# Patient Record
Sex: Female | Born: 1960 | Race: White | Hispanic: No | Marital: Married | Smoking: Never smoker
Health system: Southern US, Community
[De-identification: ages and names within clinical notes are randomized; demographics above are authoritative.]

## PROBLEM LIST (undated history)

## (undated) DIAGNOSIS — I10 Essential (primary) hypertension: Secondary | ICD-10-CM

## (undated) DIAGNOSIS — G43909 Migraine, unspecified, not intractable, without status migrainosus: Secondary | ICD-10-CM

## (undated) DIAGNOSIS — K219 Gastro-esophageal reflux disease without esophagitis: Secondary | ICD-10-CM

## (undated) DIAGNOSIS — IMO0001 Reserved for inherently not codable concepts without codable children: Secondary | ICD-10-CM

## (undated) DIAGNOSIS — M719 Bursopathy, unspecified: Secondary | ICD-10-CM

## (undated) DIAGNOSIS — R011 Cardiac murmur, unspecified: Secondary | ICD-10-CM

## (undated) DIAGNOSIS — E559 Vitamin D deficiency, unspecified: Secondary | ICD-10-CM

## (undated) HISTORY — DX: Reserved for inherently not codable concepts without codable children: IMO0001

## (undated) HISTORY — DX: Migraine, unspecified, not intractable, without status migrainosus: G43.909

## (undated) HISTORY — DX: Vitamin D deficiency, unspecified: E55.9

## (undated) HISTORY — DX: Gastro-esophageal reflux disease without esophagitis: K21.9

## (undated) HISTORY — DX: Cardiac murmur, unspecified: R01.1

## (undated) HISTORY — DX: Essential (primary) hypertension: I10

---
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
## 1999-12-23 ENCOUNTER — Encounter: Admission: RE | Admit: 1999-12-23 | Discharge: 1999-12-23 | Payer: Self-pay | Admitting: Family Medicine

## 1999-12-23 ENCOUNTER — Encounter: Payer: Self-pay | Admitting: Family Medicine

## 2001-03-01 ENCOUNTER — Other Ambulatory Visit: Admission: RE | Admit: 2001-03-01 | Discharge: 2001-03-01 | Payer: Self-pay | Admitting: Obstetrics and Gynecology

## 2001-05-10 ENCOUNTER — Encounter: Payer: Self-pay | Admitting: Obstetrics and Gynecology

## 2001-05-10 ENCOUNTER — Encounter: Admission: RE | Admit: 2001-05-10 | Discharge: 2001-05-10 | Payer: Self-pay | Admitting: Obstetrics and Gynecology

## 2002-03-21 ENCOUNTER — Other Ambulatory Visit: Admission: RE | Admit: 2002-03-21 | Discharge: 2002-03-21 | Payer: Self-pay | Admitting: *Deleted

## 2002-05-14 ENCOUNTER — Encounter: Payer: Self-pay | Admitting: Obstetrics and Gynecology

## 2002-05-14 ENCOUNTER — Encounter: Admission: RE | Admit: 2002-05-14 | Discharge: 2002-05-14 | Payer: Self-pay | Admitting: Obstetrics and Gynecology

## 2003-03-27 ENCOUNTER — Other Ambulatory Visit: Admission: RE | Admit: 2003-03-27 | Discharge: 2003-03-27 | Payer: Self-pay | Admitting: Gynecology

## 2003-05-27 ENCOUNTER — Encounter: Admission: RE | Admit: 2003-05-27 | Discharge: 2003-05-27 | Payer: Self-pay | Admitting: Obstetrics and Gynecology

## 2004-04-02 ENCOUNTER — Other Ambulatory Visit: Admission: RE | Admit: 2004-04-02 | Discharge: 2004-04-02 | Payer: Self-pay | Admitting: Gynecology

## 2004-06-02 ENCOUNTER — Encounter: Admission: RE | Admit: 2004-06-02 | Discharge: 2004-06-02 | Payer: Self-pay | Admitting: Gynecology

## 2004-07-25 HISTORY — PX: CHOLECYSTECTOMY: SHX55

## 2004-07-25 HISTORY — PX: LEG SURGERY: SHX1003

## 2004-08-18 ENCOUNTER — Other Ambulatory Visit: Admission: RE | Admit: 2004-08-18 | Discharge: 2004-08-18 | Payer: Self-pay | Admitting: Gynecology

## 2005-02-03 ENCOUNTER — Observation Stay (HOSPITAL_COMMUNITY): Admission: AD | Admit: 2005-02-03 | Discharge: 2005-02-04 | Payer: Self-pay | Admitting: Interventional Cardiology

## 2005-02-03 ENCOUNTER — Encounter: Payer: Self-pay | Admitting: Emergency Medicine

## 2005-02-28 ENCOUNTER — Ambulatory Visit (HOSPITAL_COMMUNITY): Admission: RE | Admit: 2005-02-28 | Discharge: 2005-03-01 | Payer: Self-pay | Admitting: General Surgery

## 2005-02-28 ENCOUNTER — Encounter (INDEPENDENT_AMBULATORY_CARE_PROVIDER_SITE_OTHER): Payer: Self-pay | Admitting: Specialist

## 2005-04-04 ENCOUNTER — Other Ambulatory Visit: Admission: RE | Admit: 2005-04-04 | Discharge: 2005-04-04 | Payer: Self-pay | Admitting: Gynecology

## 2005-06-23 ENCOUNTER — Encounter: Admission: RE | Admit: 2005-06-23 | Discharge: 2005-06-23 | Payer: Self-pay | Admitting: Gynecology

## 2005-11-05 IMAGING — RF DG CHOLANGIOGRAM OPERATIVE
1 series · 4 of 4 positions shown · non-contrast
Comparison: none

CLINICAL DATA: Laparoscopic cholecystectomy.
 OPERATIVE CHOLANGIOGRAM:

[Series 1: run · 4 of 81 frames shown]
[frame 13/81]
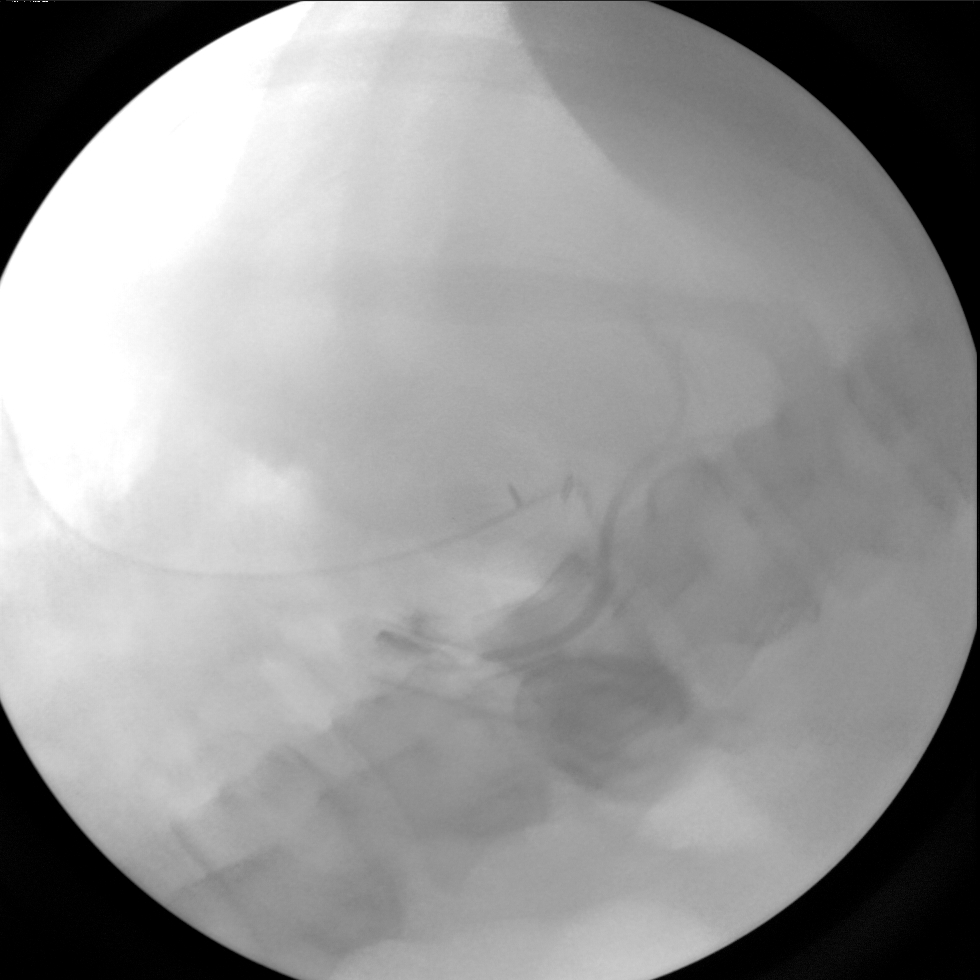
[frame 41/81]
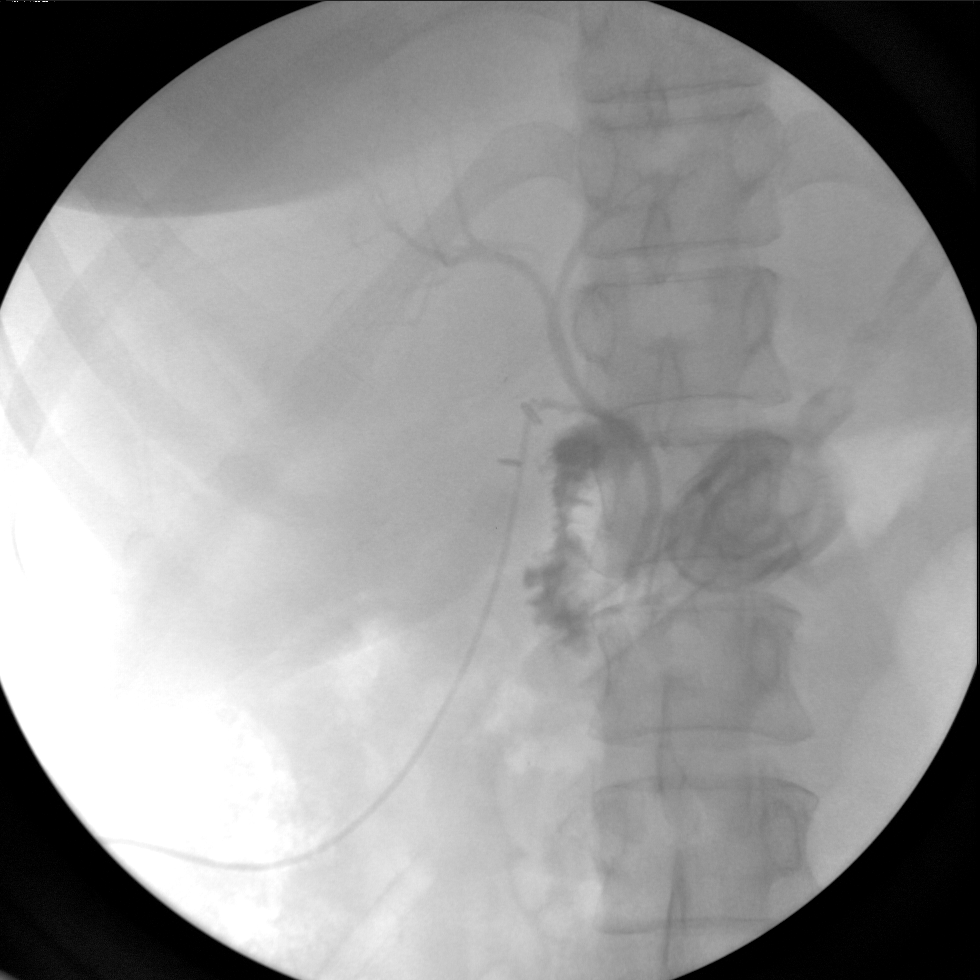
[frame 69/81]
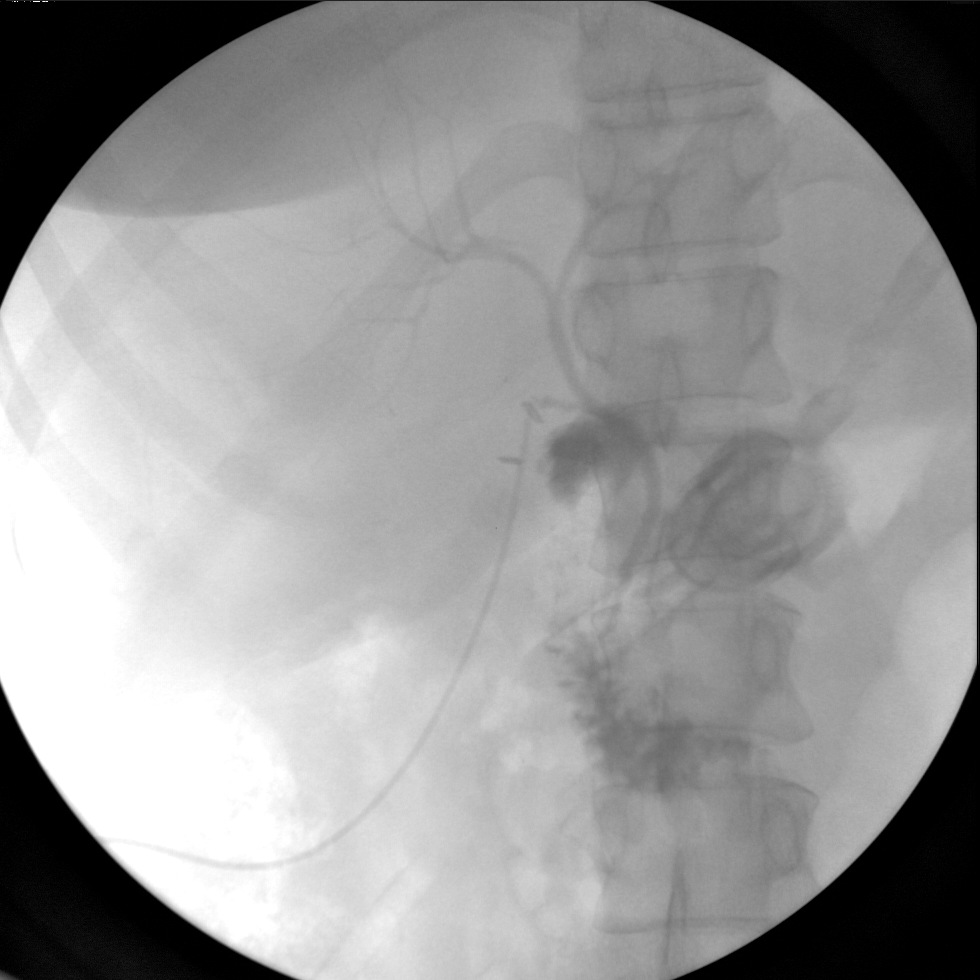
[frame 79/81]
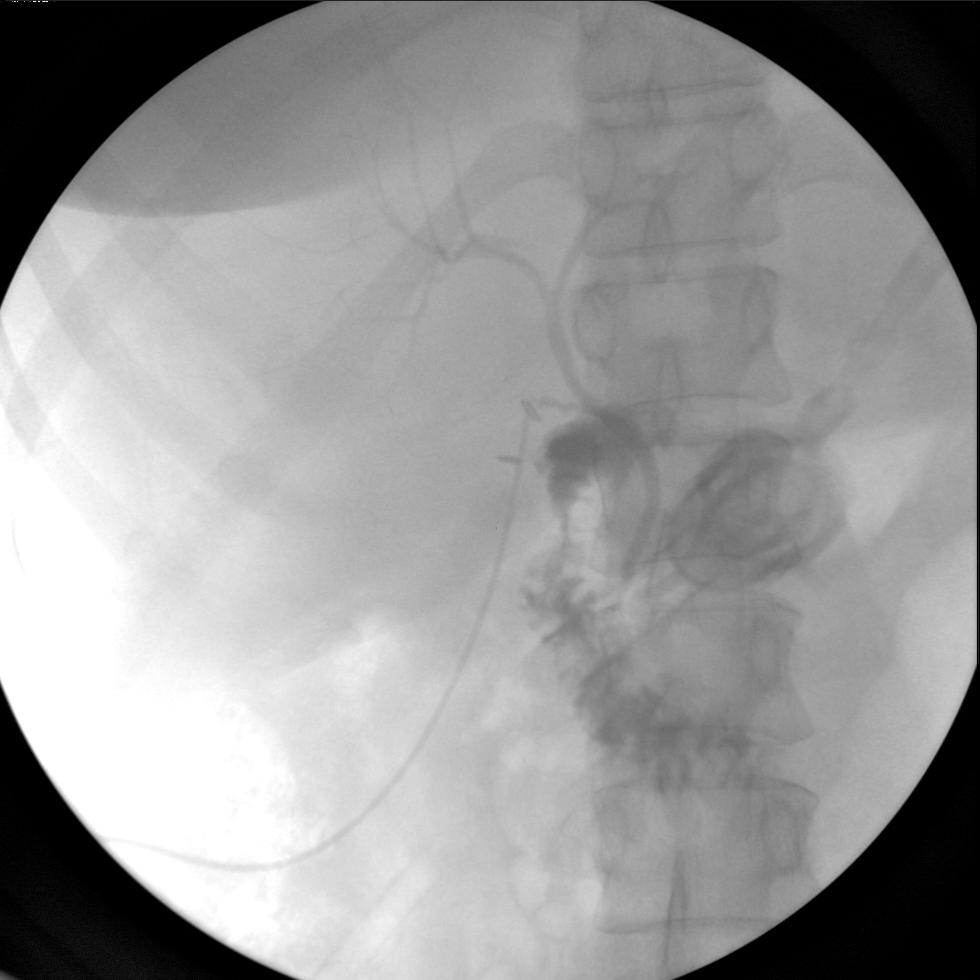

[4 of 4 positions shown; findings below may reference images not displayed]

FINDINGS: A c-arm run from the operating room is reviewed on PACS.  Normal intra- and extrahepatic biliary caliber.  No filling defects or obstruction.  There is reflux into the pancreatic duct.
IMPRESSION: Satisfactory operative cholangiogram.

## 2006-04-05 ENCOUNTER — Other Ambulatory Visit: Admission: RE | Admit: 2006-04-05 | Discharge: 2006-04-05 | Payer: Self-pay | Admitting: Gynecology

## 2007-04-11 ENCOUNTER — Other Ambulatory Visit: Admission: RE | Admit: 2007-04-11 | Discharge: 2007-04-11 | Payer: Self-pay | Admitting: Gynecology

## 2007-06-28 ENCOUNTER — Encounter: Admission: RE | Admit: 2007-06-28 | Discharge: 2007-06-28 | Payer: Self-pay | Admitting: Family Medicine

## 2008-04-11 ENCOUNTER — Ambulatory Visit: Payer: Self-pay | Admitting: Women's Health

## 2008-04-11 ENCOUNTER — Other Ambulatory Visit: Admission: RE | Admit: 2008-04-11 | Discharge: 2008-04-11 | Payer: Self-pay | Admitting: Obstetrics and Gynecology

## 2008-04-11 ENCOUNTER — Encounter: Payer: Self-pay | Admitting: Women's Health

## 2008-07-23 ENCOUNTER — Encounter: Admission: RE | Admit: 2008-07-23 | Discharge: 2008-07-23 | Payer: Self-pay | Admitting: Family Medicine

## 2009-03-16 ENCOUNTER — Ambulatory Visit: Payer: Self-pay | Admitting: Oncology

## 2009-03-20 LAB — CHCC SMEAR

## 2009-03-20 LAB — CBC WITH DIFFERENTIAL/PLATELET
BASO%: 0.4 % (ref 0.0–2.0)
Basophils Absolute: 0 10*3/uL (ref 0.0–0.1)
EOS%: 1.7 % (ref 0.0–7.0)
HGB: 13.4 g/dL (ref 11.6–15.9)
MCH: 28.9 pg (ref 25.1–34.0)
RDW: 13.1 % (ref 11.2–14.5)
lymph#: 2.9 10*3/uL (ref 0.9–3.3)

## 2009-03-20 LAB — ERYTHROCYTE SEDIMENTATION RATE: Sed Rate: 30 mm/hr (ref 0–30)

## 2009-03-26 LAB — COMPREHENSIVE METABOLIC PANEL
ALT: 10 U/L (ref 0–35)
AST: 11 U/L (ref 0–37)
Alkaline Phosphatase: 64 U/L (ref 39–117)
CO2: 19 mEq/L (ref 19–32)
Sodium: 140 mEq/L (ref 135–145)
Total Bilirubin: 0.4 mg/dL (ref 0.3–1.2)
Total Protein: 7.2 g/dL (ref 6.0–8.3)

## 2009-03-26 LAB — C-REACTIVE PROTEIN: CRP: 3 mg/dL — ABNORMAL HIGH

## 2009-04-10 ENCOUNTER — Encounter: Payer: Self-pay | Admitting: Women's Health

## 2009-04-10 ENCOUNTER — Ambulatory Visit: Payer: Self-pay | Admitting: Women's Health

## 2009-04-10 ENCOUNTER — Other Ambulatory Visit: Admission: RE | Admit: 2009-04-10 | Discharge: 2009-04-10 | Payer: Self-pay | Admitting: Obstetrics and Gynecology

## 2009-07-23 ENCOUNTER — Ambulatory Visit: Payer: Self-pay | Admitting: Oncology

## 2009-07-28 LAB — CBC WITH DIFFERENTIAL/PLATELET
Basophils Absolute: 0.1 10*3/uL (ref 0.0–0.1)
HGB: 13.4 g/dL (ref 11.6–15.9)
MCHC: 32.9 g/dL (ref 31.5–36.0)
MONO%: 3.9 % (ref 0.0–14.0)
NEUT%: 60 % (ref 38.4–76.8)
WBC: 10.6 10*3/uL — ABNORMAL HIGH (ref 3.9–10.3)

## 2010-01-08 ENCOUNTER — Encounter: Admission: RE | Admit: 2010-01-08 | Discharge: 2010-01-08 | Payer: Self-pay | Admitting: Gynecology

## 2010-04-12 ENCOUNTER — Ambulatory Visit: Payer: Self-pay | Admitting: Women's Health

## 2010-04-12 ENCOUNTER — Other Ambulatory Visit: Admission: RE | Admit: 2010-04-12 | Discharge: 2010-04-12 | Payer: Self-pay | Admitting: Obstetrics and Gynecology

## 2010-08-15 ENCOUNTER — Encounter: Payer: Self-pay | Admitting: Gynecology

## 2010-12-10 NOTE — Discharge Summary (Signed)
Loretta Contreras, Loretta Contreras                ACCOUNT NO.:  [id number]   MEDICAL RECORD NO.:  [id number]          PATIENT TYPE:  INP   LOCATION:  4712                         FACILITY:  [facility]   PHYSICIAN:  Lyn Records III, M.D.DATE OF BIRTH:  15-Oct-1960   DATE OF ADMISSION:  02/03/2005  DATE OF DISCHARGE:  02/04/2005                                 DISCHARGE SUMMARY   REASON FOR ADMISSION:  Chest pain.   DISCHARGE DIAGNOSIS:  1.  Chest pain, etiology not identified, no evidence of myocardial      infarction or inducible ischemia was noted.  2.  History of recurrent migraine headache.  3.  Positive risk factors for coronary artery disease including an elevated      sensitivity to CRP and positive family history.   CONDITION ON DISCHARGE:  Improved.   DISCHARGE MEDICATIONS:  1.  Ketoprofen 75 mg as needed for migraines.  2.  Birth control pills.  3.  Sublingual nitroglycerin for recurrent, prolonged chest discomfort.   DISCHARGE INSTRUCTIONS:  The patient is to make an appointment with her  family medical doctor at [facility] to have review evaluation  which should include a GI evaluation.  I have informed the patient that if  she continues to have chest discomfort and, certainly, if she has chest  discomfort responsive to nitroglycerin, she may need coronary angiography or  CT angiography of the coronaries.  She has an elevated high sensitivity CRP  and if her lipids are elevated, she should be placed on statin therapy.  I  have also instructed her that if she continues to have recurrences of chest  discomfort, she should recontact me.  She has the contact information.   HOSPITAL COURSE:  The patient was hospitalized on February 03, 2005, following  an episode of pain.  There were no acute EKG changes or laboratory  abnormalities.  After admission and with a totally normal exam, three sets  of enzymes were negative for evidence of infarction.  A high sensitivity CRP  was noted  to be 22.1.  A lipid profile was not performed, TSH was not  performed.  Serial EKGs did not reveal any evolutionary changes.  A stress  Cardiolite was negative for evidence of infarction and LV function was  normal.  She is discharged to be followed up by her primary care physician  to have a GI workup.  She may need further cardiac evaluation if these  symptoms continue.  She should probably also have a TSH done since she has  been complaining of some palpitations.  This was not performed while  hospitalized.       HWS/MEDQ  D:  02/04/2005  T:  02/04/2005  Job:  [id number]

## 2010-12-10 NOTE — Op Note (Signed)
NAMEAREANA, Loretta Contreras                ACCOUNT NO.:  [id number]   MEDICAL RECORD NO.:  [id number]          PATIENT TYPE:  OIB   LOCATION:  2550                         FACILITY:  [facility]   PHYSICIAN:  Gita Kudo, M.D. DATE OF BIRTH:  [birth year]   DATE OF PROCEDURE:  02/28/2005  DATE OF DISCHARGE:                                 OPERATIVE REPORT   OPERATIVE PROCEDURE:  Laparoscopic cholecystectomy with intraoperative  cholangiogram.   SURGEON:  Dr. Maryagnes Amos.   ASSISTANT:  Dr. Lurene Shadow.   ANESTHESIA:  General endotracheal.   PREOPERATIVE DIAGNOSIS:  Gallstones.   POSTOP DIAGNOSIS:  Gallstones, plus normal cholangiogram.   CLINICAL SUMMARY:  50 year old female with abdominal pain, gallbladder  ultrasound showing stones, normal liver function studies, admitted for  elective surgery.   OPERATIVE FINDINGS:  The patient multiple adhesions around her gallbladder  that appeared more congenital than inflammatory. The anatomy was normal. The  cholangiogram appeared normal without obstruction or defect.   OPERATIVE PROCEDURE:  Under satisfactory general endotracheal anesthesia,  the patient was positioned, prepped and draped in standard fashion. A total  of 25 mL of 0.5% Marcaine was infiltrated at the skin incision sites for  postop analgesia. A midline incision made at the umbilicus inferiorly and  carried into the peritoneum. Midline defect controlled with a figure-of-  eight zero Vicryl suture and Hassan port inserted and secured. Good CO2  pneumoperitoneum established and camera placed. Under direct vision, two #5  ports placed laterally and a second #10 medially. Lateral graspers gave  excellent exposure and operating through the medial port, we carefully took  down all the adhesions. The cystic duct was identified, dissected  circumferentially and when certain of the anatomy, a clip placed near the  gallbladder. Incision made, percutaneous catheter placed into the duct and  good  cholangiogram obtained. After reviewing it, catheter removed, duct  controlled with multiple clips and divided. Likewise the artery was  circumferentially dissected, controlled with multiple clips and divided.  Gallbladder then removed from the liver bed from below upward with  coagulation for both hemostasis and dissection. Small hole made in the  gallbladder and spillage of clear bile but no stones. Gallbladder was then  placed in an EndoCatch bag and secured. Operative site lavaged with a total  of 2 liters of warm saline with good clear returns and no blood or problem.  Liver bed made hemostatic by cautery and then the gallbladder removed  through the umbilical port after the camera was placed in the upper port.  The operative site again checked, the abdomen, suctioned, all CO2 and  ports released.  Midline closed with previous figure-of-eight and a second  interrupted 0 Vicryl. Subcu with 3-0 Vicryl, skin edges with Steri-Strips.  Sterile dressings were applied and the patient went to the recovery room  from the operating room in good condition without complication.       MRL/MEDQ  D:  02/28/2005  T:  02/28/2005  Job:  [id number]   cc:   Molly Maduro A. Nicholos Johns, M.D.  Fax: [phone]

## 2010-12-10 NOTE — H&P (Signed)
Loretta Contreras, Loretta Contreras                ACCOUNT NO.:  [id number]   MEDICAL RECORD NO.:  [id number]          PATIENT TYPE:  INP   LOCATION:  4712                         FACILITY:  [facility]   PHYSICIAN:  Lyn Records III, M.D.DATE OF BIRTH:  05-03-1961   DATE OF ADMISSION:  02/03/2005  DATE OF DISCHARGE:                                HISTORY & PHYSICAL   REASON FOR ADMISSION:  Chest discomfort.   SUBJECTIVE:  The patient is 50 years of age and has a one-month history of  increasingly frequent precordial chest tightness and pressure that radiates  into the left arm.  She has had approximately eight episodes over the last  month.  The episode prompted the visit to the emergency room and subsequent  admission occurred while she was under stress at her job.  It caused nausea,  sweating and dyspnea.  It lasted approximately 40 minutes.  By the time she  arrived in the emergency room, it had resolved but recurred slightly and the  sublingual nitroglycerin was given.  She feels that perhaps it helped but  she is not sure.  It did cause a severe headache in this patient with a  history of migraines.   MEDICATIONS:  1.  Ketoprofen 75 mg as needed for migraines.  2.  Birth control pills.   ALLERGIES:  1.  PENICILLIN.  2.  SULFA.   PAST MEDICAL HISTORY:  1.  Right knee surgery.  2.  Tonsillectomy.  3.  Recurrent migraine headaches.   FAMILY HISTORY:  Mother is 18, has history of atrial fibrillation.  Father  died of coronary artery disease.  Three half-siblings have coronary artery  disease.   REVIEW OF SYSTEMS:  Recurrent headaches, otherwise unremarkable.  She has no  reflux symptoms.  No history of DVT or pulmonary emboli.   HABITS:  She does not smoke or drink.   OBJECTIVE:  GENERAL APPEARANCE:  On exam, patient is in no acute distress.  Her color was good.  SKIN:  Clear.  VITAL SIGNS:  Blood pressure 140/70, heart rate 70, respirations 16 and  nonlabored.  HEENT:  Grossly  unremarkable.  NECK:  No JVD, carotid bruits and the thyroid is not palpable.  Carotid  upstroke is 2+.  LUNGS:  Clear to auscultation and percussion.  CARDIOVASCULAR:  No gallop, no rub, no click.  ABDOMEN:  Normal bowel sounds.  Liver and spleen are not palpable.  EXTREMITIES:  No edema.  Pulses 2+ and symmetric in upper and lower  extremities.  NEUROLOGIC:  Normal.   EKG is normal.   CK-MB, troponin I, hemoglobin and BMET are all normal.   Chest x-ray is unremarkable.   ASSESSMENT:  1.  Prolonged chest pain, cause is uncertain.  Certainly in this patient      with family history,  coronary artery disease is possible. Coronary      artery spasm is possible in patient with migraines.  This could also be      musculoskeletal or possible GI source.  2.  History of migraines.   PLAN:  1.  Rule out  myocardial infarction with serial enzymes.  2.  Lovenox subcutaneous.  3.  Stress Cardiolite versus catheterization.  4.  Check high sensitivity TRP.  5.  May need GI work-up.       HWS/MEDQ  D:  02/04/2005  T:  02/04/2005  Job:  [id number]   cc:   [facility]

## 2010-12-13 ENCOUNTER — Other Ambulatory Visit: Payer: Self-pay | Admitting: Family Medicine

## 2010-12-13 DIAGNOSIS — Z1231 Encounter for screening mammogram for malignant neoplasm of breast: Secondary | ICD-10-CM

## 2011-01-24 ENCOUNTER — Ambulatory Visit
Admission: RE | Admit: 2011-01-24 | Discharge: 2011-01-24 | Disposition: A | Discharge status: Home / Self Care | Payer: BC Managed Care – PPO | Source: Ambulatory Visit | Attending: Family Medicine | Admitting: Family Medicine

## 2011-01-24 DIAGNOSIS — Z1231 Encounter for screening mammogram for malignant neoplasm of breast: Secondary | ICD-10-CM

## 2011-04-11 DIAGNOSIS — I10 Essential (primary) hypertension: Secondary | ICD-10-CM | POA: Insufficient documentation

## 2011-04-11 DIAGNOSIS — G43909 Migraine, unspecified, not intractable, without status migrainosus: Secondary | ICD-10-CM | POA: Insufficient documentation

## 2011-04-11 DIAGNOSIS — F329 Major depressive disorder, single episode, unspecified: Secondary | ICD-10-CM | POA: Insufficient documentation

## 2011-04-18 ENCOUNTER — Encounter: Payer: Self-pay | Admitting: Women's Health

## 2011-04-18 ENCOUNTER — Other Ambulatory Visit (HOSPITAL_COMMUNITY)
Admission: RE | Admit: 2011-04-18 | Discharge: 2011-04-18 | Disposition: A | Discharge status: Home / Self Care | Payer: BC Managed Care – PPO | Source: Ambulatory Visit | Attending: Women's Health | Admitting: Women's Health

## 2011-04-18 ENCOUNTER — Ambulatory Visit (INDEPENDENT_AMBULATORY_CARE_PROVIDER_SITE_OTHER): Payer: BC Managed Care – PPO | Admitting: Women's Health

## 2011-04-18 VITALS — BP 130/70 | Ht 64.0 in | Wt 157.0 lb

## 2011-04-18 DIAGNOSIS — Z309 Encounter for contraceptive management, unspecified: Secondary | ICD-10-CM

## 2011-04-18 DIAGNOSIS — Z01419 Encounter for gynecological examination (general) (routine) without abnormal findings: Secondary | ICD-10-CM | POA: Insufficient documentation

## 2011-04-18 DIAGNOSIS — IMO0001 Reserved for inherently not codable concepts without codable children: Secondary | ICD-10-CM

## 2011-04-18 MED ORDER — DESOGESTREL-ETHINYL ESTRADIOL 0.15-0.02/0.01 MG (21/5) PO TABS: 1.0000 | ORAL_TABLET | Freq: Every day | ORAL | Status: DC

## 2011-04-18 NOTE — Progress Notes (Signed)
Loretta Contreras 1960-10-25 [id number]    History:    The patient presents for annual exam.  Has a 50-year-old grandson with multiple handicaps. 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   Past medical history, past surgical history, family history and social history were all reviewed and documented in the EPIC chart.   ROS:  A  ROS was performed and pertinent positives and negatives are included in the history.  Exam:  Filed Vitals:   04/18/11 0812  BP: 130/70    General appearance:  Normal Head/Neck:  Normal, without cervical or supraclavicular adenopathy. Thyroid:  Symmetrical, normal in size, without palpable masses or nodularity. Respiratory  Effort:  Normal  Auscultation:  Clear without wheezing or rhonchi Cardiovascular  Auscultation:  Regular rate, without rubs, murmurs or gallops  Edema/varicosities:  Not grossly evident Abdominal  Soft,nontender, without masses, guarding or rebound.  Liver/spleen:  No organomegaly noted  Hernia:  None appreciated  Skin  Inspection:  Grossly normal  Palpation:  Grossly normal Neurologic/psychiatric  Orientation:  Normal with appropriate conversation.  Mood/affect:  Normal  Genitourinary    Breasts: Examined lying and sitting.     Right: Without masses, retractions, discharge or axillary adenopathy.     Left: Without masses, retractions, discharge or axillary adenopathy.   Inguinal/mons:  Normal without inguinal adenopathy  External genitalia:  Normal  BUS/Urethra/Skene's glands:  Normal  Bladder:  Normal  Vagina:  Normal  Cervix:  Normal  Uterus:   normal in size, shape and contour.  Midline and mobile  Adnexa/parametria:     Rt: Without masses or tenderness.   Lt: Without masses or tenderness.  Anus and perineum: Normal  Digital rectal exam: Normal sphincter tone without palpated masses or tenderness  Assessment/Plan:  50 y.o. MWF G2P2 for annual exam with no complaints. Takes Mircette continuously  for contraception and migraine prevention.  Normal gyn exam  Plan: She does see a primary care for labs and other meds. Pap only today. SBEs, annual mammogram which was normal in May. Calcium rich diet, exercise, continue cutting calories for weight loss encouraged. Screening colonoscopy recommended,  states she will schedule. Prescription, proper use, of Mircette was given reviewed, reviewed slight risk for blood clots, strokes. She had been on medication for her blood pressure which she is now off since she has lost weight.   Harrington [facility], 8:44 AM 04/18/2011

## 2012-01-02 ENCOUNTER — Other Ambulatory Visit: Payer: Self-pay | Admitting: Family Medicine

## 2012-01-02 DIAGNOSIS — Z1231 Encounter for screening mammogram for malignant neoplasm of breast: Secondary | ICD-10-CM

## 2012-01-25 ENCOUNTER — Ambulatory Visit
Admission: RE | Admit: 2012-01-25 | Discharge: 2012-01-25 | Disposition: A | Discharge status: Home / Self Care | Payer: BC Managed Care – PPO | Source: Ambulatory Visit | Attending: Family Medicine | Admitting: Family Medicine

## 2012-01-25 DIAGNOSIS — Z1231 Encounter for screening mammogram for malignant neoplasm of breast: Secondary | ICD-10-CM

## 2012-04-19 ENCOUNTER — Ambulatory Visit (INDEPENDENT_AMBULATORY_CARE_PROVIDER_SITE_OTHER): Payer: BC Managed Care – PPO | Admitting: Women's Health

## 2012-04-19 ENCOUNTER — Encounter: Payer: Self-pay | Admitting: Women's Health

## 2012-04-19 VITALS — BP 128/80 | Ht 63.25 in | Wt 164.0 lb

## 2012-04-19 DIAGNOSIS — D691 Qualitative platelet defects: Secondary | ICD-10-CM | POA: Insufficient documentation

## 2012-04-19 DIAGNOSIS — Z01419 Encounter for gynecological examination (general) (routine) without abnormal findings: Secondary | ICD-10-CM

## 2012-04-19 DIAGNOSIS — Z79890 Hormone replacement therapy (postmenopausal): Secondary | ICD-10-CM

## 2012-04-19 MED ORDER — ESTRADIOL-NORETHINDRONE ACET 0.5-0.1 MG PO TABS: 1.0000 | ORAL_TABLET | Freq: Every day | ORAL | Status: DC

## 2012-04-19 NOTE — Patient Instructions (Signed)

## 2012-04-19 NOTE — Progress Notes (Signed)
Loretta Contreras [birth year] [id number]    History:    The patient presents for annual exam.  Currently on Mircette for contraception and migraine prevention and has been amenorrhea for many years. Past 6 months increased headaches week of placebo. Has rare intercourse, states is constantly warm/sweaty. Had a normal TSH and labs at primary care July 2013. History of increased platelets on aspirin daily. Under extreme stress has a severely handicapped grandson who she helps care for, needs 24/7 care. Has recently been able to get some assistance. History of ascus 2005 with normal Paps after and normal mammograms. History of GDM. History of hypertension, hypercholesteremia, stopped Vytorin per primary care.   Past medical history, past surgical history, family history and social history were all reviewed and documented in the EPIC chart. Counselor/social worker for developmentally disabled.   ROS:  A  ROS was performed and pertinent positives and negatives are included in the history.  Exam:  Filed Vitals:   04/19/12 0805  BP: 128/80    General appearance:  Normal Head/Neck:  Normal, without cervical or supraclavicular adenopathy. Thyroid:  Symmetrical, normal in size, without palpable masses or nodularity. Respiratory  Effort:  Normal  Auscultation:  Clear without wheezing or rhonchi Cardiovascular  Auscultation:  Regular rate, without rubs, murmurs or gallops  Edema/varicosities:  Not grossly evident Abdominal  Soft,nontender, without masses, guarding or rebound.  Liver/spleen:  No organomegaly noted  Hernia:  None appreciated  Skin  Inspection:  Grossly normal  Palpation:  Grossly normal Neurologic/psychiatric  Orientation:  Normal with appropriate conversation.  Mood/affect:  Normal  Genitourinary    Breasts: Examined lying and sitting.     Right: Without masses, retractions, discharge or axillary adenopathy.     Left: Without masses, retractions, discharge or axillary  adenopathy.   Inguinal/mons:  Normal without inguinal adenopathy  External genitalia:  Normal  BUS/Urethra/Skene's glands:  Normal  Bladder:  Normal  Vagina:  Normal  Cervix:  Normal  Uterus:   normal in size, shape and contour.  Midline and mobile  Adnexa/parametria:     Rt: Without masses or tenderness.   Lt: Without masses or tenderness.  Anus and perineum: Normal  Digital rectal exam: Normal sphincter tone without palpated masses or tenderness  Assessment/Plan:  51 y.o. M. WF G2 P2  for annual exam.   Situational stress/care of severely handicapped grandson History of migraines/ increase in number past 6 months on Mircette Primary care-labs and meds  Plan: Keep scheduled colonoscopy appointment. SBE's, annual mammogram, calcium rich diet, vitamin D 1000 daily and exercise encouraged. Reviewed changing from Mircette to Activella to see if we can decrease headaches. Activella 0.5/point one prescription, proper use, slight risk for blood clots, strokes, breast cancer reviewed and accepted. Has rare intercourse did review condoms. Has been amenorrheic for years, instructed to call if any bleeding or if headaches do not improve. Declines need for counseling or medications at this time for stress/anxiety. No Pap, new screening guidelines reviewed, history of normal Paps since 06   YOUNG,NANCY J WHNP, 12:12 PM 04/19/2012

## 2012-05-01 ENCOUNTER — Telehealth: Payer: Self-pay | Admitting: *Deleted

## 2012-05-01 NOTE — Telephone Encounter (Signed)
(  pt ware you are out to the office)Pt was seen on 04/19/12 given rx for Activella 0.5/.1 pt said she never started on Rx due to the side effects that she read, she doesn't want this medication. And would like have Rx for Mircette again. Please advise

## 2012-05-02 ENCOUNTER — Other Ambulatory Visit: Payer: Self-pay | Admitting: Women's Health

## 2012-05-02 DIAGNOSIS — IMO0001 Reserved for inherently not codable concepts without codable children: Secondary | ICD-10-CM

## 2012-05-02 MED ORDER — DESOGESTREL-ETHINYL ESTRADIOL 0.15-0.02/0.01 MG (21/5) PO TABS: 1.0000 | ORAL_TABLET | Freq: Every day | ORAL | Status: DC

## 2012-05-02 NOTE — Telephone Encounter (Signed)
Telephone call to review request for taking Mircette instead of Activella 0.5 /0.1. States would prefer to stay on Mircette, states is fearful of the side effects of Activella. Reviewed less hormones in Activella, also is fearful of headaches which she still gets most months, has been amenorrheic on Mircette. When off Mircette had numerous headaches throughout the month. Reviewed taking half tablet of Mircette and weaning off if chooses to stop hormones.

## 2012-07-02 ENCOUNTER — Other Ambulatory Visit: Payer: Self-pay | Admitting: Women's Health

## 2012-07-25 DIAGNOSIS — E559 Vitamin D deficiency, unspecified: Secondary | ICD-10-CM

## 2012-07-25 HISTORY — DX: Vitamin D deficiency, unspecified: E55.9

## 2012-12-19 ENCOUNTER — Other Ambulatory Visit: Payer: Self-pay

## 2012-12-19 DIAGNOSIS — Z1231 Encounter for screening mammogram for malignant neoplasm of breast: Secondary | ICD-10-CM

## 2013-01-28 ENCOUNTER — Ambulatory Visit
Admission: RE | Admit: 2013-01-28 | Discharge: 2013-01-28 | Disposition: A | Discharge status: Home / Self Care | Payer: BC Managed Care – PPO | Source: Ambulatory Visit

## 2013-01-28 ENCOUNTER — Ambulatory Visit: Payer: BC Managed Care – PPO

## 2013-01-28 DIAGNOSIS — Z1231 Encounter for screening mammogram for malignant neoplasm of breast: Secondary | ICD-10-CM

## 2013-02-17 ENCOUNTER — Encounter (HOSPITAL_BASED_OUTPATIENT_CLINIC_OR_DEPARTMENT_OTHER): Payer: Self-pay

## 2013-02-17 ENCOUNTER — Emergency Department (HOSPITAL_BASED_OUTPATIENT_CLINIC_OR_DEPARTMENT_OTHER)
Admission: EM | Admit: 2013-02-17 | Discharge: 2013-02-17 | Disposition: A | Discharge status: Home / Self Care | Payer: BC Managed Care – PPO | Attending: Emergency Medicine | Admitting: Emergency Medicine

## 2013-02-17 DIAGNOSIS — M25559 Pain in unspecified hip: Secondary | ICD-10-CM | POA: Insufficient documentation

## 2013-02-17 DIAGNOSIS — Z79899 Other long term (current) drug therapy: Secondary | ICD-10-CM | POA: Insufficient documentation

## 2013-02-17 DIAGNOSIS — Z7982 Long term (current) use of aspirin: Secondary | ICD-10-CM | POA: Insufficient documentation

## 2013-02-17 DIAGNOSIS — I10 Essential (primary) hypertension: Secondary | ICD-10-CM | POA: Insufficient documentation

## 2013-02-17 DIAGNOSIS — M25551 Pain in right hip: Secondary | ICD-10-CM

## 2013-02-17 DIAGNOSIS — IMO0002 Reserved for concepts with insufficient information to code with codable children: Secondary | ICD-10-CM | POA: Insufficient documentation

## 2013-02-17 DIAGNOSIS — R011 Cardiac murmur, unspecified: Secondary | ICD-10-CM | POA: Insufficient documentation

## 2013-02-17 DIAGNOSIS — Z9889 Other specified postprocedural states: Secondary | ICD-10-CM | POA: Insufficient documentation

## 2013-02-17 DIAGNOSIS — Z880 Allergy status to penicillin: Secondary | ICD-10-CM | POA: Insufficient documentation

## 2013-02-17 DIAGNOSIS — Z8739 Personal history of other diseases of the musculoskeletal system and connective tissue: Secondary | ICD-10-CM | POA: Insufficient documentation

## 2013-02-17 DIAGNOSIS — Z8679 Personal history of other diseases of the circulatory system: Secondary | ICD-10-CM | POA: Insufficient documentation

## 2013-02-17 HISTORY — DX: Bursopathy, unspecified: M71.9

## 2013-02-17 MED ORDER — TRAMADOL HCL 50 MG PO TABS: 50.0000 mg | ORAL_TABLET | Freq: Four times a day (QID) | ORAL | Status: DC | PRN

## 2013-02-17 MED ORDER — PREDNISONE 20 MG PO TABS
40.0000 mg | ORAL_TABLET | ORAL | Status: AC
  Administered 2013-02-17: 40 mg via ORAL
  Filled 2013-02-17: qty 2

## 2013-02-17 MED ORDER — PREDNISONE 10 MG PO TABS: 40.0000 mg | ORAL_TABLET | Freq: Every day | ORAL | Status: AC

## 2013-02-17 NOTE — ED Notes (Signed)
Pt reports she has bursitis in right hip, has been seen and administered medications.  States medications have helped, however continues to have pain.

## 2013-02-17 NOTE — ED Provider Notes (Signed)
CSN: [id number]     Arrival date & time 02/17/13  3086 History     First MD Initiated Contact with Patient 02/17/13 0919     Chief Complaint  Patient presents with  . Hip Pain   (Consider location/radiation/quality/duration/timing/severity/associated sxs/prior Treatment) HPI Patient presents with concern of hip pain. Patient has leg length discrepancy, since childhood. Right leg is longer, and she has had episodes of hip pain for years. Episode has been longer lasting than typical. At this episode began 2 weeks ago without clear precipitant. Since onset she said pain focally about the lateral aspect of the right hip.  Pain is worse with weightbearing, or activity. She sought urgent care physician one week ago started a course of low-dose steroids, Vicodin.  Minimal relief with these medication. No distal dysesthesia or weakness, no other fevers, chills, nausea, vomiting, weakness anywhere. She has not seen orthopedists since onset of pain.  Past Medical History  Diagnosis Date  . Migraines     menstrual headaches  . Hypertension     at 03/2005 office visit 140/92 and 140/80  . Abnormal platelets     elevated platelets  . Heart murmur   . Bursitis    Past Surgical History  Procedure Laterality Date  . Tonsillectomy  1974  . Leg surgery  2006  . Knee surgery  1973  . Cholecystectomy  2006   Family History  Problem Relation Age of Onset  . Hypertension Mother   . Heart disease Mother   . Cancer Mother     skin cancer  . Heart disease Father   . Heart disease Sister   . Heart disease Brother   . Mental retardation Grandchild    History  Substance Use Topics  . Smoking status: Never Smoker   . Smokeless tobacco: Never Used  . Alcohol Use: No   OB History   Grav Para Term Preterm Abortions TAB SAB Ect Mult Living   2 2 2       2      Review of Systems  All other systems reviewed and are negative.    Allergies  Penicillins and Sulfa antibiotics  Home  Medications   Current Outpatient Rx  Name  Route  Sig  Dispense  Refill  . Biotin 1000 MCG tablet   Oral   Take 1,000 mcg by mouth 3 (three) times daily.         Marland Kitchen HYDROcodone-acetaminophen (NORCO/VICODIN) 5-325 MG per tablet   Oral   Take 1 tablet by mouth every 6 (six) hours as needed for pain.         Marland Kitchen ibuprofen (ADVIL,MOTRIN) 200 MG tablet   Oral   Take 600 mg by mouth every 6 (six) hours as needed for pain.         Marland Kitchen ketoprofen (ORUDIS) 75 MG capsule   Oral   Take 75 mg by mouth 3 (three) times daily as needed for pain.         . predniSONE (DELTASONE) 10 MG tablet   Oral   Take 10 mg by mouth daily.         Marland Kitchen aspirin 81 MG tablet   Oral   Take 81 mg by mouth daily.           . ClonazePAM (KLONOPIN PO)   Oral   Take by mouth.           . predniSONE (DELTASONE) 10 MG tablet   Oral   Take 4 tablets (  40 mg total) by mouth daily.   12 tablet   0   . Ranitidine HCl (RANITIDINE 75 PO)   Oral   Take by mouth.           . traMADol (ULTRAM) 50 MG tablet   Oral   Take 1 tablet (50 mg total) by mouth every 6 (six) hours as needed for pain.   15 tablet   0    BP 180/86  Pulse 80  Temp(Src) 97.9 F (36.6 C) (Oral)  Resp 20  Ht 5\' 4"  (1.626 m)  Wt 148 lb (67.132 kg)  BMI 25.39 kg/m2  SpO2 100% Physical Exam  Nursing note and vitals reviewed. Constitutional: She is oriented to person, place, and time. She appears well-developed and well-nourished. No distress.  HENT:  Head: Normocephalic and atraumatic.  Eyes: Conjunctivae and EOM are normal.  Cardiovascular: Normal rate and regular rhythm.   Pulmonary/Chest: Effort normal and breath sounds normal. No stridor. No respiratory distress.  Abdominal: She exhibits no distension.  Musculoskeletal: She exhibits no edema.  The right leg is longer than the left leg by approximately 3 cm. Strength of the hip is appropriate in the right, with no gross deformity.  There is tenderness to palpation about  the greater trochanter, and posterior hip fossa.  Range of motion is appropriate. The remainder of the right leg is unremarkable aside from surgical scar on the lateral proximal tibia.   Neurological: She is alert and oriented to person, place, and time. No cranial nerve deficit.  Skin: Skin is warm and dry.  Psychiatric: She has a normal mood and affect.    ED Course   Procedures (including critical care time)  Labs Reviewed - No data to display No results found. 1. Hip joint pain, right     MDM  Patient presented with recurrent hip pain, but now sustained.  On exam she is awake and alert, neurovascularly intact.  Given the tenderness to palpation, there is some suspicion for inflammatory process.  Absent fevers, chills, erythema, there is low suspicion for septic arthritis, and absent trauma, low suspicion for fracture.  The patient was counseled on the need to follow with orthopedists for additional evaluation, management, facilitation of physical therapy. Patient was started on a new pain medication regimen, provided return precautions and discharged in stable condition.  Gerhard Munch, MD 02/17/13 1011

## 2013-02-18 ENCOUNTER — Encounter: Payer: Self-pay | Admitting: Family Medicine

## 2013-02-18 ENCOUNTER — Ambulatory Visit (INDEPENDENT_AMBULATORY_CARE_PROVIDER_SITE_OTHER): Payer: BC Managed Care – PPO | Admitting: Family Medicine

## 2013-02-18 VITALS — BP 173/91 | HR 99 | Ht 64.0 in | Wt 148.0 lb

## 2013-02-18 DIAGNOSIS — M25551 Pain in right hip: Secondary | ICD-10-CM

## 2013-02-18 DIAGNOSIS — M25559 Pain in unspecified hip: Secondary | ICD-10-CM

## 2013-02-18 NOTE — Patient Instructions (Addendum)
You have trochanteric bursitis, gluteus medius spasms/weakness and piriformis syndrome - all of these are closely related. Avoid painful activities as much as possible. Ice over area of pain 3-4 times a day for 15 minutes at a time Hip abduction exercise 3 x 10 once a day - add weights if this becomes too easy. Stretches - pick 2 and hold for 20-30 seconds x 3 - do once or twice a day. Consider formal physical therapy for more extensive home program. Finish prednisone then switch back to your ketoprofen. Follow up with me in 1 month for reevaluation.

## 2013-02-18 NOTE — Progress Notes (Signed)
Patient ID: Loretta Contreras, female   DOB: 06/24/1961, 52 y.o.   MRN: [id number]  PCP: No primary provider on file.  Subjective:   HPI: Patient is a 52 y.o. female here for right hip pain.  Patient denies known injury. States early this year January 2014 started walking 6 miles a day - done well with this. Then about 1 month ago started developing lateral and posterior right hip pain. No swelling or bruising. Pain only bothers her with movement. Tried prednisone and norco with some improvement. Went to ED yesterday and given another course of prednisone for 3 days and tramadol. Pain worse with rolling over. Takes ketoprofen for migraines. Tried advil, tylenol.  Past Medical History  Diagnosis Date  . Migraines     menstrual headaches  . Hypertension     at 03/2005 office visit 140/92 and 140/80  . Abnormal platelets     elevated platelets  . Heart murmur   . Bursitis     Current Outpatient Prescriptions on File Prior to Visit  Medication Sig Dispense Refill  . aspirin 81 MG tablet Take 81 mg by mouth daily.        . Biotin 1000 MCG tablet Take 1,000 mcg by mouth 3 (three) times daily.      . ClonazePAM (KLONOPIN PO) Take by mouth.        . ketoprofen (ORUDIS) 75 MG capsule Take 75 mg by mouth 3 (three) times daily as needed for pain.      . predniSONE (DELTASONE) 10 MG tablet Take 4 tablets (40 mg total) by mouth daily.  12 tablet  0  . Ranitidine HCl (RANITIDINE 75 PO) Take by mouth.        . traMADol (ULTRAM) 50 MG tablet Take 1 tablet (50 mg total) by mouth every 6 (six) hours as needed for pain.  15 tablet  0   No current facility-administered medications on file prior to visit.    Past Surgical History  Procedure Laterality Date  . Tonsillectomy  1974  . Leg surgery  2006  . Cholecystectomy  2006  . Knee surgery  1973    Allergies  Allergen Reactions  . Penicillins   . Sulfa Antibiotics     History   Social History  . Marital Status: Married    Spouse  Name: N/A    Number of Children: N/A  . Years of Education: N/A   Occupational History  . Not on file.   Social History Main Topics  . Smoking status: Never Smoker   . Smokeless tobacco: Never Used  . Alcohol Use: No  . Drug Use: No  . Sexually Active: Yes -- Female partner(s)    Birth Control/ Protection: Pill   Other Topics Concern  . Not on file   Social History Narrative  . No narrative on file    Family History  Problem Relation Age of Onset  . Hypertension Mother   . Heart disease Mother   . Cancer Mother     skin cancer  . Heart attack Mother   . Heart disease Father   . Heart attack Father   . Heart disease Sister   . Heart disease Brother   . Mental retardation Grandchild   . Hyperlipidemia Neg Hx   . Diabetes Neg Hx     BP 173/91  Pulse 99  Ht 5\' 4"  (1.626 m)  Wt 148 lb (67.132 kg)  BMI 25.39 kg/m2  Review of Systems: See HPI above.  Objective:  Physical Exam:  Gen: NAD  Back/R hip: No gross deformity, scoliosis. TTP over greater trochanter and just posterior to this.  No midline or bony TTP.  No back TTP. FROM with pain on flexion and extension within lateral hip. Strength LEs 5/5 all muscle groups except 4/5 painful with hip abduction.   2+ MSRs in patellar and achilles tendons, equal bilaterally. Negative SLRs. Sensation intact to light touch bilaterally. Negative logroll bilateral hips Positive right piriformis.  Negative fabers, left piriformis.    Assessment & Plan:  1. Right hip pain - consistent with combination of gluteus medius weakness, bursitis, piriformis syndrome.  Start home exercise program (Declined PT for now).  Avoid painful activities, walking for exercise while pain > 3/10 and/or limping.  Patient would like to try injection which was given today.  Icing, finish prednisone that was given by ED.  F/u in 1 month.  After informed written consent patient was lying on left side on exam table.  Area overlying right greater  trochanter prepped with alcohol swab and bursa injected with 6:2 marcaine: depomedrol.  Patient tolerated procedure well without immediate complications.

## 2013-02-18 NOTE — Assessment & Plan Note (Signed)
consistent with combination of gluteus medius weakness, bursitis, piriformis syndrome.  Start home exercise program (Declined PT for now).  Avoid painful activities, walking for exercise while pain > 3/10 and/or limping.  Patient would like to try injection which was given today.  Icing, finish prednisone that was given by ED.  F/u in 1 month.  After informed written consent patient was lying on left side on exam table.  Area overlying right greater trochanter prepped with alcohol swab and bursa injected with 6:2 marcaine: depomedrol.  Patient tolerated procedure well without immediate complications.

## 2013-03-22 ENCOUNTER — Encounter: Payer: Self-pay | Admitting: Family Medicine

## 2013-03-22 ENCOUNTER — Ambulatory Visit (INDEPENDENT_AMBULATORY_CARE_PROVIDER_SITE_OTHER): Payer: BC Managed Care – PPO | Admitting: Family Medicine

## 2013-03-22 VITALS — BP 139/89 | HR 85

## 2013-03-22 DIAGNOSIS — M25551 Pain in right hip: Secondary | ICD-10-CM

## 2013-03-22 DIAGNOSIS — M25559 Pain in unspecified hip: Secondary | ICD-10-CM

## 2013-03-22 NOTE — Progress Notes (Signed)
Patient ID: Loretta Contreras, female   DOB: Jan 16, 1961, 52 y.o.   MRN: [id number]  PCP: No primary provider on file.  Subjective:   HPI: Patient is a 52 y.o. female here for f/u right hip pain.  7/28: Patient denies known injury. States early this year January 2014 started walking 6 miles a day - done well with this. Then about 1 month ago started developing lateral and posterior right hip pain. No swelling or bruising. Pain only bothers her with movement. Tried prednisone and norco with some improvement. Went to ED yesterday and given another course of prednisone for 3 days and tramadol. Pain worse with rolling over. Takes ketoprofen for migraines. Tried advil, tylenol.  8/29: Patient reports she feels nearly 100% improved. After 4 days the injection helped tremendously. Able to walk for an hour now outdoors - very little pain at this point unless walking on an incline for this amount of tie. No numbness or tingling. No night pain. Still using ketoprofen as only medicine for pain.  Past Medical History  Diagnosis Date  . Migraines     menstrual headaches  . Hypertension     at 03/2005 office visit 140/92 and 140/80  . Abnormal platelets     elevated platelets  . Heart murmur   . Bursitis     Current Outpatient Prescriptions on File Prior to Visit  Medication Sig Dispense Refill  . aspirin 81 MG tablet Take 81 mg by mouth daily.        . Biotin 1000 MCG tablet Take 1,000 mcg by mouth 3 (three) times daily.      . ClonazePAM (KLONOPIN PO) Take by mouth.        . ketoprofen (ORUDIS) 75 MG capsule Take 75 mg by mouth 3 (three) times daily as needed for pain.      . Ranitidine HCl (RANITIDINE 75 PO) Take by mouth.        . traMADol (ULTRAM) 50 MG tablet Take 1 tablet (50 mg total) by mouth every 6 (six) hours as needed for pain.  15 tablet  0   No current facility-administered medications on file prior to visit.    Past Surgical History  Procedure Laterality Date  .  Tonsillectomy  1974  . Leg surgery  2006  . Cholecystectomy  2006  . Knee surgery  1973    Allergies  Allergen Reactions  . Penicillins   . Sulfa Antibiotics     History   Social History  . Marital Status: Married    Spouse Name: N/A    Number of Children: N/A  . Years of Education: N/A   Occupational History  . Not on file.   Social History Main Topics  . Smoking status: Never Smoker   . Smokeless tobacco: Never Used  . Alcohol Use: No  . Drug Use: No  . Sexual Activity: Yes    Partners: Male    Birth Control/ Protection: Pill   Other Topics Concern  . Not on file   Social History Narrative  . No narrative on file    Family History  Problem Relation Age of Onset  . Hypertension Mother   . Heart disease Mother   . Cancer Mother     skin cancer  . Heart attack Mother   . Heart disease Father   . Heart attack Father   . Heart disease Sister   . Heart disease Brother   . Mental retardation Grandchild   . Hyperlipidemia Neg  Hx   . Diabetes Neg Hx     BP 139/89  Pulse 85  Review of Systems: See HPI above.    Objective:  Physical Exam:  Gen: NAD  Back/R hip: No gross deformity, scoliosis. No longer TTP over greater trochanter or posterior to this.  No midline or bony TTP.  No back TTP. FROM without pain. Strength LEs 5/5 all muscle groups including hip abduction.   2+ MSRs in patellar and achilles tendons, equal bilaterally. Negative SLRs. Sensation intact to light touch bilaterally. Negative logroll bilateral hips Negative right piriformis.  Negative fabers, left piriformis.    Assessment & Plan:  1. Right hip pain - significantly improved with home exercises (doing twice daily), troch bursa injection.  Pain was consistent with combination of gluteus medius weakness, bursitis, piriformis syndrome.  Continue home exercises most days of the week for next 6 weeks.  Icing, ketoprofen as needed.  F/u prn.

## 2013-03-22 NOTE — Patient Instructions (Addendum)
See note

## 2013-03-22 NOTE — Assessment & Plan Note (Signed)
significantly improved with home exercises (doing twice daily), troch bursa injection.  Pain was consistent with combination of gluteus medius weakness, bursitis, piriformis syndrome.  Continue home exercises most days of the week for next 6 weeks.  Icing, ketoprofen as needed.  F/u prn.

## 2013-04-24 ENCOUNTER — Encounter: Payer: Self-pay | Admitting: Women's Health

## 2013-05-02 ENCOUNTER — Encounter: Payer: Self-pay | Admitting: Women's Health

## 2013-05-16 ENCOUNTER — Ambulatory Visit (INDEPENDENT_AMBULATORY_CARE_PROVIDER_SITE_OTHER): Payer: BC Managed Care – PPO | Admitting: Women's Health

## 2013-05-16 ENCOUNTER — Encounter: Payer: Self-pay | Admitting: Women's Health

## 2013-05-16 ENCOUNTER — Other Ambulatory Visit (HOSPITAL_COMMUNITY)
Admission: RE | Admit: 2013-05-16 | Discharge: 2013-05-16 | Disposition: A | Discharge status: Home / Self Care | Payer: BC Managed Care – PPO | Source: Ambulatory Visit | Attending: Gynecology | Admitting: Gynecology

## 2013-05-16 VITALS — BP 108/70 | Ht 63.5 in | Wt 159.2 lb

## 2013-05-16 DIAGNOSIS — Z01419 Encounter for gynecological examination (general) (routine) without abnormal findings: Secondary | ICD-10-CM | POA: Insufficient documentation

## 2013-05-16 DIAGNOSIS — Z309 Encounter for contraceptive management, unspecified: Secondary | ICD-10-CM

## 2013-05-16 DIAGNOSIS — IMO0001 Reserved for inherently not codable concepts without codable children: Secondary | ICD-10-CM

## 2013-05-16 MED ORDER — DESOGESTREL-ETHINYL ESTRADIOL 0.15-0.02/0.01 MG (21/5) PO TABS: ORAL_TABLET | ORAL | Status: DC

## 2013-05-16 NOTE — Progress Notes (Signed)
Loretta Contreras Sep 21, 1960 [id number]    History:    The patient presents for annual exam.  Mircette continuously for menstrual/ migraine prevention and contraception. Continues to have headaches end of pack becoming less severe. Discussed HRT last year declined was fearful of increasing headaches. Requests to continue on Mircette, minimal menopausal symptoms. Ascus 2005 with normal Paps after. Normal mammogram history. History of gestational diabetes. History of hypertension/hypercholesterolemia meds lifestyle changes currently on no medication.  History of increased platelets on aspirin daily. Last several months dealing with increased right hip pain with sciatica, prednisone, cortisone injection pain is decreasing.  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 Cholecystectomy 2006.   ROS:  A  ROS was performed and pertinent positives and negatives are included in the history.  Exam:  Filed Vitals:   05/16/13 0821  BP: 108/70    General appearance:  Normal Head/Neck:  Normal, without cervical or supraclavicular adenopathy. Thyroid:  Symmetrical, normal in size, without palpable masses or nodularity. Respiratory  Effort:  Normal  Auscultation:  Clear without wheezing or rhonchi Cardiovascular  Auscultation:  Regular rate, without rubs, murmurs or gallops  Edema/varicosities:  Not grossly evident Abdominal  Soft,nontender, without masses, guarding or rebound.  Liver/spleen:  No organomegaly noted  Hernia:  None appreciated  Skin  Inspection:  Grossly normal  Palpation:  Grossly normal Neurologic/psychiatric  Orientation:  Normal with appropriate conversation.  Mood/affect:  Normal  Genitourinary    Breasts: Examined lying and sitting.     Right: Without masses, retractions, discharge or axillary adenopathy.     Left: Without masses,  retractions, discharge or axillary adenopathy.   Inguinal/mons:  Normal without inguinal adenopathy  External genitalia:  Normal  BUS/Urethra/Skene's glands:  Normal  Bladder:  Normal  Vagina:  Normal  Cervix:  Normal  Uterus:   normal in size, shape and contour.  Midline and mobile  Adnexa/parametria:     Rt: Without masses or tenderness.   Lt: Without masses or tenderness.  Anus and perineum: Normal  Digital rectal exam: Normal sphincter tone without palpated masses or tenderness  Assessment/Plan:  52 y.o.MWF G2P2  for annual exam.    Perimenopausal on Mircette for contraception/migraine prevention Elevated platelets aspirin per primary care/labs primary care  Plan: Mircette prescription, takes continuously, proper use, slight risk for blood clots and strokes reviewed. Will try Vivelle 0.025 patch week of headaches. Samples given will call if wants refill. FSH  placebo week. next year. Keep scheduled appointment for screening colonoscopy. SBE's, continue annual mammogram, continue regular exercise, walking 4-6 miles most days. Calcium rich diet encouraged, continue vitamin D 50,000 units per primary care. Pap, Pap normal 2012, new screening guidelines reviewed.   Harrington Challenger [facility], 5:07 PM 05/16/2013

## 2013-05-16 NOTE — Patient Instructions (Signed)

## 2013-07-22 ENCOUNTER — Other Ambulatory Visit: Payer: Self-pay | Admitting: Women's Health

## 2013-10-21 ENCOUNTER — Telehealth: Payer: Self-pay | Admitting: *Deleted

## 2013-10-21 MED ORDER — ESTRADIOL 0.025 MG/24HR TD PTTW: 1.0000 | MEDICATED_PATCH | TRANSDERMAL | Status: DC

## 2013-10-21 NOTE — Telephone Encounter (Signed)
Pt given samples of minivelle dot patches on OV 05/16/13 has done well with them would like Rx sent. This will be done.

## 2013-12-25 ENCOUNTER — Other Ambulatory Visit: Payer: Self-pay

## 2013-12-25 DIAGNOSIS — Z1231 Encounter for screening mammogram for malignant neoplasm of breast: Secondary | ICD-10-CM

## 2014-01-29 ENCOUNTER — Encounter (INDEPENDENT_AMBULATORY_CARE_PROVIDER_SITE_OTHER): Payer: Self-pay

## 2014-01-29 ENCOUNTER — Ambulatory Visit
Admission: RE | Admit: 2014-01-29 | Discharge: 2014-01-29 | Disposition: A | Discharge status: Home / Self Care | Payer: BC Managed Care – PPO | Source: Ambulatory Visit

## 2014-01-29 DIAGNOSIS — Z1231 Encounter for screening mammogram for malignant neoplasm of breast: Secondary | ICD-10-CM

## 2014-05-21 ENCOUNTER — Ambulatory Visit (INDEPENDENT_AMBULATORY_CARE_PROVIDER_SITE_OTHER): Payer: BC Managed Care – PPO | Admitting: Women's Health

## 2014-05-21 ENCOUNTER — Encounter: Payer: Self-pay | Admitting: Women's Health

## 2014-05-21 VITALS — BP 150/82 | Ht 63.0 in | Wt 173.0 lb

## 2014-05-21 DIAGNOSIS — Z780 Asymptomatic menopausal state: Secondary | ICD-10-CM

## 2014-05-21 DIAGNOSIS — Z01411 Encounter for gynecological examination (general) (routine) with abnormal findings: Secondary | ICD-10-CM

## 2014-05-21 LAB — FOLLICLE STIMULATING HORMONE: FSH: 1.8 m[IU]/mL

## 2014-05-21 MED ORDER — PROGESTERONE MICRONIZED 200 MG PO CAPS: 200.0000 mg | ORAL_CAPSULE | Freq: Every day | ORAL | Status: DC

## 2014-05-21 MED ORDER — ESTRADIOL 0.05 MG/24HR TD PTWK: 0.0500 mg | MEDICATED_PATCH | TRANSDERMAL | Status: DC

## 2014-05-21 NOTE — Progress Notes (Signed)
Loretta Contreras [birth year] [id number]    History:    Presents for annual exam.  Amenorrheic on Mircette continuously with increased hot flushes. Had been on Mircette continuously for migraine prevention. Has had some increase in headaches, (no migraines) when off this past month. History of hypertension and hypercholesteremia had made lifestyle changes and is on no medication. 2005 ascus with normal Paps after. Gestational diabetes. Normal mammogram history. Has not had a colonoscopy.  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 M,F,S, hypertension, mother heart disease. [place].  ROS:  A  12 point ROS was performed and pertinent positives and negatives are included.  Exam:  Filed Vitals:   05/21/14 0806  BP: 150/82    General appearance:  Normal Thyroid:  Symmetrical, normal in size, without palpable masses or nodularity. Respiratory  Auscultation:  Clear without wheezing or rhonchi Cardiovascular  Auscultation:  Regular rate, without rubs, murmurs or gallops  Edema/varicosities:  Not grossly evident Abdominal  Soft,nontender, without masses, guarding or rebound.  Liver/spleen:  No organomegaly noted  Hernia:  None appreciated  Skin  Inspection:  Grossly normal   Breasts: Examined lying and sitting.     Right: Without masses, retractions, discharge or axillary adenopathy.     Left: Without masses, retractions, discharge or axillary adenopathy. Gentitourinary   Inguinal/mons:  Normal without inguinal adenopathy  External genitalia:  Normal  BUS/Urethra/Skene's glands:  Normal  Vagina:  Normal  Cervix:  Normal  Uterus:   normal in size, shape and contour.  Midline and mobile  Adnexa/parametria:     Rt: Without masses or tenderness.   Lt: Without masses or tenderness.  Anus and perineum: Normal  Digital rectal  exam: Normal sphincter tone without palpated masses or tenderness  Assessment/Plan:  53 y.o. MWF G2P2 for annual exam with complaint of estrogen dependent headaches.  Estrogen dependent headaches 2005 ascus with normal Paps after Labs-primary care BP elevated today-headache Strained family relations-currently in therapy  Plan: FSH, estradiol patch 0.05 patch weekly, Prometrium 200 at bedtime day 1 through 12 of each month. Prescription, proper use, risk for blood clots, strokes, breast cancer reviewed. Instructed to call if continued problems with headaches. No relief with Tylenol, Motrin or Vicodin with headaches. SBE's, continue annual mammogram, calcium rich diet, vitamin D per primary care. DEXA next year. Reviewed importance of increasing leisure, has strained family relations with daughter. Pap normal 2014, new screening guidelines reviewed. Instructed to schedule screening colonoscopy.  Loretta Contreras,Loretta Contreras [facility] Surgery [facility] LtdWHNP, 9:55 AM 05/21/2014

## 2014-05-21 NOTE — Patient Instructions (Signed)
Health Recommendations for Postmenopausal Women Respected and ongoing research has looked at the most common causes of death, disability, and poor quality of life in postmenopausal women. The causes include heart disease, diseases of blood vessels, diabetes, depression, cancer, and bone loss (osteoporosis). Many things can be done to help lower the chances of developing these and other common problems. CARDIOVASCULAR DISEASE Heart Disease: A heart attack is a medical emergency. Know the signs and symptoms of a heart attack. Below are things women can do to reduce their risk for heart disease.   Do not smoke. If you smoke, quit.  Aim for a healthy weight. Being overweight causes many preventable deaths. Eat a healthy and balanced diet and drink an adequate amount of liquids.  Get moving. Make a commitment to be more physically active. Aim for 30 minutes of activity on most, if not all days of the week.  Eat for heart health. Choose a diet that is low in saturated fat and cholesterol and eliminate trans fat. Include whole grains, vegetables, and fruits. Read and understand the labels on food containers before buying.  Know your numbers. Ask your caregiver to check your blood pressure, cholesterol (total, HDL, LDL, triglycerides) and blood glucose. Work with your caregiver on improving your entire clinical picture.  High blood pressure. Limit or stop your table salt intake (try salt substitute and food seasonings). Avoid salty foods and drinks. Read labels on food containers before buying. Eating well and exercising can help control high blood pressure. STROKE  Stroke is a medical emergency. Stroke may be the result of a blood clot in a blood vessel in the brain or by a brain hemorrhage (bleeding). Know the signs and symptoms of a stroke. To lower the risk of developing a stroke:  Avoid fatty foods.  Quit smoking.  Control your diabetes, blood pressure, and irregular heart rate. THROMBOPHLEBITIS  (BLOOD CLOT) OF THE LEG  Becoming overweight and leading a stationary lifestyle may also contribute to developing blood clots. Controlling your diet and exercising will help lower the risk of developing blood clots. CANCER SCREENING  Breast Cancer: Take steps to reduce your risk of breast cancer.  You should practice "breast self-awareness." This means understanding the normal appearance and feel of your breasts and should include breast self-examination. Any changes detected, no matter how small, should be reported to your caregiver.  After age 40, you should have a clinical breast exam (CBE) every year.  Starting at age 40, you should consider having a mammogram (breast X-ray) every year.  If you have a family history of breast cancer, talk to your caregiver about genetic screening.  If you are at high risk for breast cancer, talk to your caregiver about having an MRI and a mammogram every year.  Intestinal or Stomach Cancer: Tests to consider are a rectal exam, fecal occult blood, sigmoidoscopy, and colonoscopy. Women who are high risk may need to be screened at an earlier age and more often.  Cervical Cancer:  Beginning at age 30, you should have a Pap test every 3 years as long as the past 3 Pap tests have been normal.  If you have had past treatment for cervical cancer or a condition that could lead to cancer, you need Pap tests and screening for cancer for at least 20 years after your treatment.  If you had a hysterectomy for a problem that was not cancer or a condition that could lead to cancer, then you no longer need Pap tests.    If you are between ages 65 and 70, and you have had normal Pap tests going back 10 years, you no longer need Pap tests.  If Pap tests have been discontinued, risk factors (such as a new sexual partner) need to be reassessed to determine if screening should be resumed.  Some medical problems can increase the chance of getting cervical cancer. In these  cases, your caregiver may recommend more frequent screening and Pap tests.  Uterine Cancer: If you have vaginal bleeding after reaching menopause, you should notify your caregiver.  Ovarian Cancer: Other than yearly pelvic exams, there are no reliable tests available to screen for ovarian cancer at this time except for yearly pelvic exams.  Lung Cancer: Yearly chest X-rays can detect lung cancer and should be done on high risk women, such as cigarette smokers and women with chronic lung disease (emphysema).  Skin Cancer: A complete body skin exam should be done at your yearly examination. Avoid overexposure to the sun and ultraviolet light lamps. Use a strong sun block cream when in the sun. All of these things are important for lowering the risk of skin cancer. MENOPAUSE Menopause Symptoms: Hormone therapy products are effective for treating symptoms associated with menopause:  Moderate to severe hot flashes.  Night sweats.  Mood swings.  Headaches.  Tiredness.  Loss of sex drive.  Insomnia.  Other symptoms. Hormone replacement carries certain risks, especially in older women. Women who use or are thinking about using estrogen or estrogen with progestin treatments should discuss that with their caregiver. Your caregiver will help you understand the benefits and risks. The ideal dose of hormone replacement therapy is not known. The Food and Drug Administration (FDA) has concluded that hormone therapy should be used only at the lowest doses and for the shortest amount of time to reach treatment goals.  OSTEOPOROSIS Protecting Against Bone Loss and Preventing Fracture If you use hormone therapy for prevention of bone loss (osteoporosis), the risks for bone loss must outweigh the risk of the therapy. Ask your caregiver about other medications known to be safe and effective for preventing bone loss and fractures. To guard against bone loss or fractures, the following is recommended:  If  you are younger than age 50, take 1000 mg of calcium and at least 600 mg of Vitamin D per day.  If you are older than age 50 but younger than age 70, take 1200 mg of calcium and at least 600 mg of Vitamin D per day.  If you are older than age 70, take 1200 mg of calcium and at least 800 mg of Vitamin D per day. Smoking and excessive alcohol intake increases the risk of osteoporosis. Eat foods rich in calcium and vitamin D and do weight bearing exercises several times a week as your caregiver suggests. DIABETES Diabetes Mellitus: If you have type I or type 2 diabetes, you should keep your blood sugar under control with diet, exercise, and recommended medication. Avoid starchy and fatty foods, and too many sweets. Being overweight can make diabetes control more difficult. COGNITION AND MEMORY Cognition and Memory: Menopausal hormone therapy is not recommended for the prevention of cognitive disorders such as Alzheimer's disease or memory loss.  DEPRESSION  Depression may occur at any age, but it is common in elderly women. This may be because of physical, medical, social (loneliness), or financial problems and needs. If you are experiencing depression because of medical problems and control of symptoms, talk to your caregiver about this. Physical   activity and exercise may help with mood and sleep. Community and volunteer involvement may improve your sense of value and worth. If you have depression and you feel that the problem is getting worse or becoming severe, talk to your caregiver about which treatment options are best for you. ACCIDENTS  Accidents are common and can be serious in elderly woman. Prepare your house to prevent accidents. Eliminate throw rugs, place hand bars in bath, shower, and toilet areas. Avoid wearing high heeled shoes or walking on wet, snowy, and icy areas. Limit or stop driving if you have vision or hearing problems, or if you feel you are unsteady with your movements and  reflexes. HEPATITIS C Hepatitis C is a type of viral infection affecting the liver. It is spread mainly through contact with blood from an infected person. It can be treated, but if left untreated, it can lead to severe liver damage over the years. Many people who are infected do not know that the virus is in their blood. If you are a "baby-boomer", it is recommended that you have one screening test for Hepatitis C. IMMUNIZATIONS  Several immunizations are important to consider having during your senior years, including:   Tetanus, diphtheria, and pertussis booster shot.  Influenza every year before the flu season begins.  Pneumonia vaccine.  Shingles vaccine.  Others, as indicated based on your specific needs. Talk to your caregiver about these. Document Released: 09/02/2005 Document Revised: 11/25/2013 Document Reviewed: 04/28/2008 [employer] Patient Information 2015 [employer]. This information is not intended to replace advice given to you by your health care provider. Make sure you discuss any questions you have with your health care provider.  

## 2014-05-22 ENCOUNTER — Other Ambulatory Visit: Payer: Self-pay | Admitting: Women's Health

## 2014-05-22 ENCOUNTER — Telehealth: Payer: Self-pay | Admitting: *Deleted

## 2014-05-22 LAB — URINALYSIS W MICROSCOPIC + REFLEX CULTURE
BILIRUBIN URINE: NEGATIVE
Bacteria, UA: NONE SEEN
CASTS: NONE SEEN
Crystals: NONE SEEN
GLUCOSE, UA: NEGATIVE mg/dL
KETONES UR: NEGATIVE mg/dL
Nitrite: NEGATIVE
PH: 5.5 (ref 5.0–8.0)
Protein, ur: NEGATIVE mg/dL
Specific Gravity, Urine: 1.016 (ref 1.005–1.030)
Urobilinogen, UA: 0.2 mg/dL (ref 0.0–1.0)

## 2014-05-22 MED ORDER — DESOGESTREL-ETHINYL ESTRADIOL 0.15-0.02/0.01 MG (21/5) PO TABS
ORAL_TABLET | ORAL | Status: DC
Start: 2014-05-22 — End: 2015-05-28

## 2014-05-22 MED ORDER — DESOGESTREL-ETHINYL ESTRADIOL 0.15-0.02/0.01 MG (21/5) PO TABS: 1.0000 | ORAL_TABLET | Freq: Every day | ORAL | Status: DC

## 2014-05-22 NOTE — Telephone Encounter (Signed)
Pt said Rx for BCP Mircette directions should be taking continuously. So she can skip placebo pills. rx will be sent per result note.

## 2014-05-24 LAB — URINE CULTURE

## 2014-05-26 ENCOUNTER — Encounter: Payer: Self-pay | Admitting: Women's Health

## 2014-05-26 ENCOUNTER — Other Ambulatory Visit: Payer: Self-pay

## 2014-05-26 MED ORDER — NITROFURANTOIN MONOHYD MACRO 100 MG PO CAPS: 100.0000 mg | ORAL_CAPSULE | Freq: Two times a day (BID) | ORAL | Status: DC

## 2014-05-26 MED ORDER — FLUCONAZOLE 150 MG PO TABS: 150.0000 mg | ORAL_TABLET | Freq: Once | ORAL | Status: DC

## 2015-02-03 ENCOUNTER — Other Ambulatory Visit: Payer: Self-pay

## 2015-02-03 DIAGNOSIS — Z1231 Encounter for screening mammogram for malignant neoplasm of breast: Secondary | ICD-10-CM

## 2015-02-19 ENCOUNTER — Ambulatory Visit
Admission: RE | Admit: 2015-02-19 | Discharge: 2015-02-19 | Disposition: A | Discharge status: Home / Self Care | Payer: BLUE CROSS/BLUE SHIELD | Source: Ambulatory Visit

## 2015-02-19 DIAGNOSIS — Z1231 Encounter for screening mammogram for malignant neoplasm of breast: Secondary | ICD-10-CM

## 2015-05-24 ENCOUNTER — Other Ambulatory Visit: Payer: Self-pay | Admitting: Women's Health

## 2015-05-28 ENCOUNTER — Ambulatory Visit (INDEPENDENT_AMBULATORY_CARE_PROVIDER_SITE_OTHER): Payer: BLUE CROSS/BLUE SHIELD | Admitting: Women's Health

## 2015-05-28 ENCOUNTER — Encounter: Payer: Self-pay | Admitting: Women's Health

## 2015-05-28 ENCOUNTER — Other Ambulatory Visit (HOSPITAL_COMMUNITY)
Admission: RE | Admit: 2015-05-28 | Discharge: 2015-05-28 | Disposition: A | Discharge status: Home / Self Care | Payer: BLUE CROSS/BLUE SHIELD | Source: Ambulatory Visit | Attending: Women's Health | Admitting: Women's Health

## 2015-05-28 VITALS — BP 130/80 | Ht 63.0 in | Wt 187.0 lb

## 2015-05-28 DIAGNOSIS — Z01419 Encounter for gynecological examination (general) (routine) without abnormal findings: Secondary | ICD-10-CM | POA: Diagnosis present

## 2015-05-28 DIAGNOSIS — N841 Polyp of cervix uteri: Secondary | ICD-10-CM

## 2015-05-28 DIAGNOSIS — Z1151 Encounter for screening for human papillomavirus (HPV): Secondary | ICD-10-CM | POA: Insufficient documentation

## 2015-05-28 DIAGNOSIS — Z79890 Hormone replacement therapy (postmenopausal): Secondary | ICD-10-CM

## 2015-05-28 LAB — FOLLICLE STIMULATING HORMONE: FSH: 1.1 m[IU]/mL

## 2015-05-28 MED ORDER — PROGESTERONE MICRONIZED 100 MG PO CAPS: 100.0000 mg | ORAL_CAPSULE | Freq: Every day | ORAL | Status: AC

## 2015-05-28 MED ORDER — ESTRADIOL 1 MG PO TABS: 1.0000 mg | ORAL_TABLET | Freq: Every day | ORAL | Status: AC

## 2015-05-28 NOTE — Patient Instructions (Signed)
Menopause is a normal process in which your reproductive ability comes to an end. This process happens gradually over a span of months to years, usually between the ages of 48 and 55. Menopause is complete when you have missed 12 consecutive menstrual periods. It is important to talk with your health care Loretta Contreras about some of the most common conditions that affect postmenopausal women, such as heart disease, cancer, and bone loss (osteoporosis). Adopting a healthy lifestyle and getting preventive care can help to promote your health and wellness. Those actions can also lower your chances of developing some of these common conditions. WHAT SHOULD I KNOW ABOUT MENOPAUSE? During menopause, you may experience a number of symptoms, such as:  Moderate-to-severe hot flashes.  Night sweats.  Decrease in sex drive.  Mood swings.  Headaches.  Tiredness.  Irritability.  Memory problems.  Insomnia. Choosing to treat or not to treat menopausal changes is an individual decision that you make with your health care Loretta Contreras. WHAT SHOULD I KNOW ABOUT HORMONE REPLACEMENT THERAPY AND SUPPLEMENTS? Hormone therapy products are effective for treating symptoms that are associated with menopause, such as hot flashes and night sweats. Hormone replacement carries certain risks, especially as you become older. If you are thinking about using estrogen or estrogen with progestin treatments, discuss the benefits and risks with your health care Loretta Contreras. WHAT SHOULD I KNOW ABOUT HEART DISEASE AND STROKE? Heart disease, heart attack, and stroke become more likely as you age. This may be due, in part, to the hormonal changes that your body experiences during menopause. These can affect how your body processes dietary fats, triglycerides, and cholesterol. Heart attack and stroke are both medical emergencies. There are many things that you can do to help prevent heart disease and stroke:  Have your blood pressure  checked at least every 1-2 years. High blood pressure causes heart disease and increases the risk of stroke.  If you are 55-79 years old, ask your health care Loretta Contreras if you should take aspirin to prevent a heart attack or a stroke.  Do not use any tobacco products, including cigarettes, chewing tobacco, or electronic cigarettes. If you need help quitting, ask your health care Loretta Contreras.  It is important to eat a healthy diet and maintain a healthy weight.  Be sure to include plenty of vegetables, fruits, low-fat dairy products, and lean protein.  Avoid eating foods that are high in solid fats, added sugars, or salt (sodium).  Get regular exercise. This is one of the most important things that you can do for your health.  Try to exercise for at least 150 minutes each week. The type of exercise that you do should increase your heart rate and make you sweat. This is known as moderate-intensity exercise.  Try to do strengthening exercises at least twice each week. Do these in addition to the moderate-intensity exercise.  Know your numbers.Ask your health care Loretta Contreras to check your cholesterol and your blood glucose. Continue to have your blood tested as directed by your health care Loretta Contreras. WHAT SHOULD I KNOW ABOUT CANCER SCREENING? There are several types of cancer. Take the following steps to reduce your risk and to catch any cancer development as early as possible. Breast Cancer  Practice breast self-awareness.  This means understanding how your breasts normally appear and feel.  It also means doing regular breast self-exams. Let your health care Loretta Contreras know about any changes, no matter how small.  If you are 40 or older, have a clinician do a   breast exam (clinical breast exam or CBE) every year. Depending on your age, family history, and medical history, it may be recommended that you also have a yearly breast X-ray (mammogram).  If you have a family history of breast cancer,  talk with your health care Loretta Contreras about genetic screening.  If you are at high risk for breast cancer, talk with your health care Loretta Contreras about having an MRI and a mammogram every year.  Breast cancer (BRCA) gene test is recommended for women who have family members with BRCA-related cancers. Results of the assessment will determine the need for genetic counseling and BRCA1 and for BRCA2 testing. BRCA-related cancers include these types:  Breast. This occurs in males or females.  Ovarian.  Tubal. This may also be called fallopian tube cancer.  Cancer of the abdominal or pelvic lining (peritoneal cancer).  Prostate.  Pancreatic. Cervical, Uterine, and Ovarian Cancer Your health care Loretta Contreras may recommend that you be screened regularly for cancer of the pelvic organs. These include your ovaries, uterus, and vagina. This screening involves a pelvic exam, which includes checking for microscopic changes to the surface of your cervix (Pap test).  For women ages 21-65, health care providers may recommend a pelvic exam and a Pap test every three years. For women ages 77-65, they may recommend the Pap test and pelvic exam, combined with testing for human papilloma virus (HPV), every five years. Some types of HPV increase your risk of cervical cancer. Testing for HPV may also be done on women of any age who have unclear Pap test results.  Other health care providers may not recommend any screening for nonpregnant women who are considered low risk for pelvic cancer and have no symptoms. Ask your health care Loretta Contreras if a screening pelvic exam is right for you.  If you have had past treatment for cervical cancer or a condition that could lead to cancer, you need Pap tests and screening for cancer for at least 20 years after your treatment. If Pap tests have been discontinued for you, your risk factors (such as having a new sexual partner) need to be reassessed to determine if you should start having  screenings again. Some women have medical problems that increase the chance of getting cervical cancer. In these cases, your health care Miki Blank may recommend that you have screening and Pap tests more often.  If you have a family history of uterine cancer or ovarian cancer, talk with your health care Teodoro Jeffreys about genetic screening.  If you have vaginal bleeding after reaching menopause, tell your health care Deivi Huckins.  There are currently no reliable tests available to screen for ovarian cancer. Lung Cancer Lung cancer screening is recommended for adults 3-70 years old who are at high risk for lung cancer because of a history of smoking. A yearly low-dose CT scan of the lungs is recommended if you:  Currently smoke.  Have a history of at least 30 pack-years of smoking and you currently smoke or have quit within the past 15 years. A pack-year is smoking an average of one pack of cigarettes per day for one year. Yearly screening should:  Continue until it has been 15 years since you quit.  Stop if you develop a health problem that would prevent you from having lung cancer treatment. Colorectal Cancer  This type of cancer can be detected and can often be prevented.  Routine colorectal cancer screening usually begins at age 38 and continues through age 12.  If you have  risk factors for colon cancer, your health care Mayford Alberg may recommend that you be screened at an earlier age.  If you have a family history of colorectal cancer, talk with your health care Carizma Dunsworth about genetic screening.  Your health care Jos Cygan may also recommend using home test kits to check for hidden blood in your stool.  A small camera at the end of a tube can be used to examine your colon directly (sigmoidoscopy or colonoscopy). This is done to check for the earliest forms of colorectal cancer.  Direct examination of the colon should be repeated every 5-10 years until age 67. However, if early forms of  precancerous polyps or small growths are found or if you have a family history or genetic risk for colorectal cancer, you may need to be screened more often. Skin Cancer  Check your skin from head to toe regularly.  Monitor any moles. Be sure to tell your health care Avion Patella:  About any new moles or changes in moles, especially if there is a change in a mole's shape or color.  If you have a mole that is larger than the size of a pencil eraser.  If any of your family members has a history of skin cancer, especially at a young age, talk with your health care Farha Dano about genetic screening.  Always use sunscreen. Apply sunscreen liberally and repeatedly throughout the day.  Whenever you are outside, protect yourself by wearing long sleeves, pants, a wide-brimmed hat, and sunglasses. WHAT SHOULD I KNOW ABOUT OSTEOPOROSIS? Osteoporosis is a condition in which bone destruction happens more quickly than new bone creation. After menopause, you may be at an increased risk for osteoporosis. To help prevent osteoporosis or the bone fractures that can happen because of osteoporosis, the following is recommended:  If you are 39-61 years old, get at least 1,000 mg of calcium and at least 600 mg of vitamin D per day.  If you are older than age 16 but younger than age 7, get at least 1,200 mg of calcium and at least 600 mg of vitamin D per day.  If you are older than age 47, get at least 1,200 mg of calcium and at least 800 mg of vitamin D per day. Smoking and excessive alcohol intake increase the risk of osteoporosis. Eat foods that are rich in calcium and vitamin D, and do weight-bearing exercises several times each week as directed by your health care Yarah Fuente. WHAT SHOULD I KNOW ABOUT HOW MENOPAUSE AFFECTS [place]? Depression may occur at any age, but it is more common as you become older. Common symptoms of depression include:  Low or sad mood.  Changes in sleep patterns.  Changes  in appetite or eating patterns.  Feeling an overall lack of motivation or enjoyment of activities that you previously enjoyed.  Frequent crying spells. Talk with your health care Eupha Lobb if you think that you are experiencing depression. WHAT SHOULD I KNOW ABOUT IMMUNIZATIONS? It is important that you get and maintain your immunizations. These include:  Tetanus, diphtheria, and pertussis (Tdap) booster vaccine.  Influenza every year before the flu season begins.  Pneumonia vaccine.  Shingles vaccine. Your health care Kathy Wahid may also recommend other immunizations.   This information is not intended to replace advice given to you by your health care Carl Bleecker. Make sure you discuss any questions you have with your health care Nereida Schepp.   Document Released: 09/02/2005 Document Revised: 08/01/2014 Document Reviewed: 03/13/2014 Elsevier Interactive Patient Education 2016 Else[employer] Hormone Therapy At menopause, your body begins making less estrogen and progesterone hormones. This causes the body to stop having menstrual periods. This is because estrogen and progesterone hormones control your periods and menstrual cycle. A lack of estrogen may cause symptoms such as:  Hot flushes (or hot flashes).  Vaginal dryness.  Dry skin.  Loss of sex drive.  Risk of bone loss (osteoporosis). When this happens, you may choose to take hormone therapy to get back the estrogen lost during menopause. When the hormone estrogen is given alone, it is usually referred to as ET (Estrogen Therapy). When the hormone progestin is combined with estrogen, it is generally called HT (Hormone Therapy). This was formerly known as hormone replacement therapy (HRT). Your caregiver can help you make a decision on what will be best for you. The decision to use HT seems to change often as new studies are done. Many studies do not agree on the benefits of hormone replacement therapy. LIKELY BENEFITS OF HT INCLUDE  PROTECTION FROM:  Hot Flushes (also called hot flashes) - A hot flush is a sudden feeling of heat that spreads over the face and body. The skin may redden like a blush. It is connected with sweats and sleep disturbance. Women going through menopause may have hot flushes a few times a month or several times per day depending on the woman.  Osteoporosis (bone loss) - Estrogen helps guard against bone loss. After menopause, a woman's bones slowly lose calcium and become weak and brittle. As a result, bones are more likely to break. The hip, wrist, and spine are affected most often. Hormone therapy can help slow bone loss after menopause. Weight bearing exercise and taking calcium with vitamin D also can help prevent bone loss. There are also medications that your caregiver can prescribe that can help prevent osteoporosis.  Vaginal dryness - Loss of estrogen causes changes in the vagina. Its lining may become thin and dry. These changes can cause pain and bleeding during sexual intercourse. Dryness can also lead to infections. This can cause burning and itching. (Vaginal estrogen treatment can help relieve pain, itching, and dryness.)  Urinary tract infections are more common after menopause because of lack of estrogen. Some women also develop urinary incontinence because of low estrogen levels in the vagina and bladder.  Possible other benefits of estrogen include a positive effect on mood and short-term memory in women. RISKS AND COMPLICATIONS  Using estrogen alone without progesterone causes the lining of the uterus to grow. This increases the risk of lining of the uterus (endometrial) cancer. Your caregiver should give another hormone called progestin if you have a uterus.  Women who take combined (estrogen and progestin) HT appear to have an increased risk of breast cancer. The risk appears to be small, but increases throughout the time that HT is taken.  Combined therapy also makes the breast  tissue slightly denser which makes it harder to read mammograms (breast X-rays).  Combined, estrogen and progesterone therapy can be taken together every day, in which case there may be spotting of blood. HT therapy can be taken cyclically in which case you will have menstrual periods. Cyclically means HT is taken for a set amount of days, then not taken, then this process is repeated.  HT may increase the risk of stroke, heart attack, breast cancer and forming blood clots in your leg.  Transdermal estrogen (estrogen that is absorbed through the skin with a patch or a cream) may have better  results with:  Cholesterol.  Blood pressure.  Blood clots. Having the following conditions may indicate you should not have HT:  Endometrial cancer.  Liver disease.  Breast cancer.  Heart disease.  History of blood clots.  Stroke. TREATMENT   If you choose to take HT and have a uterus, usually estrogen and progestin are prescribed.  Your caregiver will help you decide the best way to take the medications.  Possible ways to take estrogen include:  Pills.  Patches.  Gels.  Sprays.  Vaginal estrogen cream, rings and tablets.  It is best to take the lowest dose possible that will help your symptoms and take them for the shortest period of time that you can.  Hormone therapy can help relieve some of the problems (symptoms) that affect women at menopause. Before making a decision about HT, talk to your caregiver about what is best for you. Be well informed and comfortable with your decisions. HOME CARE INSTRUCTIONS   Follow your caregivers advice when taking the medications.  A Pap test is done to screen for cervical cancer.  The first Pap test should be done at age 21.  Between ages 21 and 29, Pap tests are repeated every 2 years.  Beginning at age 30, you are advised to have a Pap test every 3 years as long as the past 3 Pap tests have been normal.  Some women have medical  problems that increase the chance of getting cervical cancer. Talk to your caregiver about these problems. It is especially important to talk to your caregiver if a new problem develops soon after your last Pap test. In these cases, your caregiver may recommend more frequent screening and Pap tests.  The above recommendations are the same for women who have or have not gotten the vaccine for HPV (human papillomavirus).  If you had a hysterectomy for a problem that was not a cancer or a condition that could lead to cancer, then you no longer need Pap tests. However, even if you no longer need a Pap test, a regular exam is a good idea to make sure no other problems are starting.  If you are between ages 65 and 70, and you have had normal Pap tests going back 10 years, you no longer need Pap tests. However, even if you no longer need a Pap test, a regular exam is a good idea to make sure no other problems are starting.  If you have had past treatment for cervical cancer or a condition that could lead to cancer, you need Pap tests and screening for cancer for at least 20 years after your treatment.  If Pap tests have been discontinued, risk factors (such as a new sexual partner)need to be re-assessed to determine if screening should be resumed.  Some women may need screenings more often if they are at high risk for cervical cancer.  Get mammograms done as per the advice of your caregiver. SEEK IMMEDIATE MEDICAL CARE IF:  You develop abnormal vaginal bleeding.  You have pain or swelling in your legs, shortness of breath, or chest pain.  You develop dizziness or headaches.  You have lumps or changes in your breasts or armpits.  You have slurred speech.  You develop weakness or numbness of your arms or legs.  You have pain, burning, or bleeding when urinating.  You develop abdominal pain.   This information is not intended to replace advice given to you by your health care Lutie Pickler. Make  sure you   discuss any questions you have with your health care Larri Yehle.   Document Released: 04/09/2003 Document Revised: 11/25/2014 Document Reviewed: 01/12/2015 Elsevier Interactive Patient Education [employer].

## 2015-05-28 NOTE — Progress Notes (Signed)
Loretta Contreras [birth year] [id number]    History:    Presents for annual exam. Amenorrheic for 17 years/on Azurette tablet daily and estradiol patch weekly. Complains of hormonal headaches every month, lasting about 3 days in duration. Frequently wears 2 estradiol patches when headaches occur with minimal relief. History of migraines without aura, states no migraines for greater than 5 years. FSH last year was 2. Continues to have hot flashes and decreased sleep. Denies any vaginal bleeding. History of hypertension and hypercholesteremia had made lifestyle changes and is on no medication. Has struggled with weight. Lost 40 pounds, 2 years ago gained it all back after stressful life event. 2005 ascus with normal Paps after. Gestational diabetes. Normal mammogram history. Colonoscopy scheduled for after Thanksgiving.   Past medical history, past surgical history, family history and social history were all reviewed and documented in the EPIC chart. Works as a Warden/rangerpsychologist. Good relationship with husband. Poor relationship with daughter, Daughter attempted suicide 2 years ago, since then has no longer been able to see grandchildren. Financially supports daughter and grandchildren, planning to do this for 2 more years. Wishes to move to beach house after.  ROS:  A ROS was performed and pertinent positives and negatives are included.  Exam:  Filed Vitals:   05/28/15 0816  BP: 130/80    General appearance:  Normal Thyroid:  Symmetrical, normal in size, without palpable masses or nodularity. Respiratory  Auscultation:  Clear without wheezing or rhonchi Cardiovascular  Auscultation:  Regular rate, without rubs, murmurs or gallops  Edema/varicosities:  Not grossly evident Abdominal  Soft,nontender, without masses, guarding or rebound.  Liver/spleen:  No organomegaly noted  Hernia:  None appreciated  Skin  Inspection:  Grossly normal   Breasts: Examined lying and sitting.     Right: Without  masses, retractions, discharge or axillary adenopathy.     Left: Without masses, retractions, discharge or axillary adenopathy. Gentitourinary   Inguinal/mons:  Normal without inguinal adenopathy  External genitalia:  Normal  BUS/Urethra/Skene's glands:  Normal  Vagina:  Normal  Cervix:  4 mm polyp noted in cervical os, removed with forceps.  Uterus:  Normal in size, shape and contour.  Midline and mobile  Adnexa/parametria:     Rt: Without masses or tenderness.   Lt: Without masses or tenderness.  Anus and perineum: Normal  Digital rectal exam: Normal sphincter tone without palpated masses or tenderness  Assessment/Plan:  54 y.o.  for annual exam with complaints of hormonal headaches monthly.  Amenorrheic-17 years, on Azurette continuously and estradiol patch Estrogen dependent headaches 2005 ascus with normal Paps after Strained family relations-currently in therapy Overweight-lost 11 pounds in 3 months Labs-primary care Endocervical polyp  Plan: FSH, Pap with HR HPV typing, new screening guidelines reviewed. Endocervical  polyp sent for biopsy. Discontinue Azurette. Begin estradiol 1 mg tablet every day for 2 weeks, then decrease to every other day. Estradiol patch 0.1 weekly. Prometrin 100 mg tablet by mouth daily at bedtime. Risks of blood clots, strokes and breast cancer reviewed. Call if new regimen does not provide relief for headaches. Given education material on hormones. Encouraged to continue therapy and participate in leisure activities. SBE's and annual 3-D mammogram. Consume calcium rich diet, decrease calories, vitamin D, multivitamin daily, and exercise encouraged. Keep scheduled appointment for colonoscopy.     Harrington ChallengerYOUNG,NANCY J Regional Rehabilitation InstituteWHNP, 9:35 AM 05/28/2015

## 2015-05-29 ENCOUNTER — Other Ambulatory Visit: Payer: Self-pay | Admitting: Women's Health

## 2015-05-29 DIAGNOSIS — Z780 Asymptomatic menopausal state: Secondary | ICD-10-CM

## 2015-05-29 LAB — URINALYSIS W MICROSCOPIC + REFLEX CULTURE
BACTERIA UA: NONE SEEN [HPF]
Bilirubin Urine: NEGATIVE
CASTS: NONE SEEN [LPF]
Crystals: NONE SEEN [HPF]
Glucose, UA: NEGATIVE
Ketones, ur: NEGATIVE
Nitrite: NEGATIVE
PROTEIN: NEGATIVE
RBC / HPF: NONE SEEN RBC/HPF (ref <=2)
Specific Gravity, Urine: 1.018 (ref 1.001–1.035)
YEAST: NONE SEEN [HPF]
pH: 6 (ref 5.0–8.0)

## 2015-05-29 LAB — CYTOLOGY - PAP

## 2015-05-29 MED ORDER — ESTRADIOL 0.05 MG/24HR TD PTWK: 0.0500 mg | MEDICATED_PATCH | TRANSDERMAL | Status: AC

## 2015-05-30 LAB — URINE CULTURE

## 2016-03-30 ENCOUNTER — Other Ambulatory Visit: Payer: Self-pay | Admitting: Nurse Practitioner

## 2016-03-30 DIAGNOSIS — Z1231 Encounter for screening mammogram for malignant neoplasm of breast: Secondary | ICD-10-CM

## 2016-04-15 ENCOUNTER — Ambulatory Visit
Admission: RE | Admit: 2016-04-15 | Discharge: 2016-04-15 | Disposition: A | Discharge status: Home / Self Care | Payer: BLUE CROSS/BLUE SHIELD | Source: Ambulatory Visit | Attending: Nurse Practitioner | Admitting: Nurse Practitioner

## 2016-04-15 DIAGNOSIS — Z1231 Encounter for screening mammogram for malignant neoplasm of breast: Secondary | ICD-10-CM

## 2016-12-07 ENCOUNTER — Encounter: Payer: Self-pay | Admitting: Gynecology

## 2017-06-13 ENCOUNTER — Other Ambulatory Visit: Payer: Self-pay | Admitting: Nurse Practitioner

## 2017-06-13 DIAGNOSIS — Z1231 Encounter for screening mammogram for malignant neoplasm of breast: Secondary | ICD-10-CM

## 2017-07-13 ENCOUNTER — Ambulatory Visit
Admission: RE | Admit: 2017-07-13 | Discharge: 2017-07-13 | Disposition: A | Discharge status: Home / Self Care | Payer: BLUE CROSS/BLUE SHIELD | Source: Ambulatory Visit | Attending: Nurse Practitioner | Admitting: Nurse Practitioner

## 2017-07-13 DIAGNOSIS — Z1231 Encounter for screening mammogram for malignant neoplasm of breast: Secondary | ICD-10-CM

## 2018-06-26 ENCOUNTER — Other Ambulatory Visit: Payer: Self-pay | Admitting: Family Medicine

## 2018-06-26 DIAGNOSIS — Z1231 Encounter for screening mammogram for malignant neoplasm of breast: Secondary | ICD-10-CM

## 2018-08-02 ENCOUNTER — Ambulatory Visit
Admission: RE | Admit: 2018-08-02 | Discharge: 2018-08-02 | Disposition: A | Discharge status: Home / Self Care | Payer: BLUE CROSS/BLUE SHIELD | Source: Ambulatory Visit | Attending: Family Medicine | Admitting: Family Medicine

## 2018-08-02 DIAGNOSIS — Z1231 Encounter for screening mammogram for malignant neoplasm of breast: Secondary | ICD-10-CM

## 2019-10-14 ENCOUNTER — Other Ambulatory Visit: Payer: Self-pay | Admitting: Family Medicine

## 2019-10-14 DIAGNOSIS — Z1231 Encounter for screening mammogram for malignant neoplasm of breast: Secondary | ICD-10-CM

## 2019-11-08 ENCOUNTER — Ambulatory Visit
Admission: RE | Admit: 2019-11-08 | Discharge: 2019-11-08 | Disposition: A | Discharge status: Home / Self Care | Payer: BLUE CROSS/BLUE SHIELD | Source: Ambulatory Visit | Attending: Family Medicine | Admitting: Family Medicine

## 2019-11-08 ENCOUNTER — Other Ambulatory Visit: Payer: Self-pay

## 2019-11-08 DIAGNOSIS — Z1231 Encounter for screening mammogram for malignant neoplasm of breast: Secondary | ICD-10-CM

## 2020-12-22 ENCOUNTER — Other Ambulatory Visit: Payer: Self-pay | Admitting: Nurse Practitioner

## 2020-12-22 DIAGNOSIS — Z1231 Encounter for screening mammogram for malignant neoplasm of breast: Secondary | ICD-10-CM

## 2021-03-22 ENCOUNTER — Other Ambulatory Visit: Payer: Self-pay | Admitting: Family Medicine

## 2021-03-22 ENCOUNTER — Other Ambulatory Visit: Payer: Self-pay

## 2021-03-22 ENCOUNTER — Ambulatory Visit
Admission: RE | Admit: 2021-03-22 | Discharge: 2021-03-22 | Disposition: A | Discharge status: Home / Self Care | Payer: BC Managed Care – PPO | Source: Ambulatory Visit | Attending: Nurse Practitioner | Admitting: Nurse Practitioner

## 2021-03-22 DIAGNOSIS — Z1231 Encounter for screening mammogram for malignant neoplasm of breast: Secondary | ICD-10-CM

## 2022-04-11 ENCOUNTER — Other Ambulatory Visit: Payer: Self-pay | Admitting: Family Medicine

## 2022-04-11 DIAGNOSIS — Z1231 Encounter for screening mammogram for malignant neoplasm of breast: Secondary | ICD-10-CM

## 2022-05-06 ENCOUNTER — Ambulatory Visit
Admission: RE | Admit: 2022-05-06 | Discharge: 2022-05-06 | Disposition: A | Discharge status: Home / Self Care | Payer: BC Managed Care – PPO | Source: Ambulatory Visit | Attending: Family Medicine | Admitting: Family Medicine

## 2022-05-06 DIAGNOSIS — Z1231 Encounter for screening mammogram for malignant neoplasm of breast: Secondary | ICD-10-CM

## 2023-04-18 ENCOUNTER — Other Ambulatory Visit: Payer: Self-pay | Admitting: Family Medicine

## 2023-04-18 DIAGNOSIS — Z1231 Encounter for screening mammogram for malignant neoplasm of breast: Secondary | ICD-10-CM

## 2023-05-23 ENCOUNTER — Ambulatory Visit
Admission: RE | Admit: 2023-05-23 | Discharge: 2023-05-23 | Disposition: A | Discharge status: Home / Self Care | Payer: BC Managed Care – PPO | Source: Ambulatory Visit | Attending: Family Medicine | Admitting: Family Medicine

## 2023-05-23 DIAGNOSIS — Z1231 Encounter for screening mammogram for malignant neoplasm of breast: Secondary | ICD-10-CM
